# Patient Record
Sex: Female | Born: 1955
Health system: Southern US, Community
[De-identification: ages and names within clinical notes are randomized; demographics above are authoritative.]

## PROBLEM LIST (undated history)

## (undated) ENCOUNTER — Emergency Department (HOSPITAL_COMMUNITY): Discharge status: Against Medical Advice | Payer: 59

## (undated) DIAGNOSIS — F988 Other specified behavioral and emotional disorders with onset usually occurring in childhood and adolescence: Secondary | ICD-10-CM

## (undated) HISTORY — PX: TUBAL LIGATION: SHX77

---

## 1999-08-05 ENCOUNTER — Other Ambulatory Visit: Admission: RE | Admit: 1999-08-05 | Discharge: 1999-08-05 | Payer: Self-pay | Admitting: Family Medicine

## 2000-08-22 ENCOUNTER — Other Ambulatory Visit: Admission: RE | Admit: 2000-08-22 | Discharge: 2000-08-22 | Payer: Self-pay | Admitting: Family Medicine

## 2000-11-16 ENCOUNTER — Encounter: Payer: Self-pay | Admitting: Family Medicine

## 2000-11-16 ENCOUNTER — Ambulatory Visit (HOSPITAL_COMMUNITY): Admission: RE | Admit: 2000-11-16 | Discharge: 2000-11-16 | Payer: Self-pay | Admitting: *Deleted

## 2002-05-12 ENCOUNTER — Ambulatory Visit (HOSPITAL_COMMUNITY): Admission: RE | Admit: 2002-05-12 | Discharge: 2002-05-12 | Payer: Self-pay | Admitting: Family Medicine

## 2002-05-12 ENCOUNTER — Encounter: Payer: Self-pay | Admitting: Family Medicine

## 2006-05-03 ENCOUNTER — Other Ambulatory Visit: Admission: RE | Admit: 2006-05-03 | Discharge: 2006-05-03 | Payer: Self-pay | Admitting: *Deleted

## 2007-03-11 ENCOUNTER — Emergency Department (HOSPITAL_COMMUNITY): Admission: EM | Admit: 2007-03-11 | Discharge: 2007-03-12 | Payer: Self-pay | Admitting: Emergency Medicine

## 2013-11-25 ENCOUNTER — Observation Stay (HOSPITAL_COMMUNITY): Payer: 59

## 2013-11-25 ENCOUNTER — Emergency Department (HOSPITAL_COMMUNITY): Payer: 59

## 2013-11-25 ENCOUNTER — Observation Stay (HOSPITAL_COMMUNITY): Payer: 59 | Admitting: Anesthesiology

## 2013-11-25 ENCOUNTER — Observation Stay (HOSPITAL_COMMUNITY)
Admission: EM | Admit: 2013-11-25 | Discharge: 2013-11-26 | Disposition: A | Discharge status: Home / Self Care | Payer: 59 | Attending: Surgery | Admitting: Surgery

## 2013-11-25 ENCOUNTER — Encounter (HOSPITAL_COMMUNITY): Payer: 59 | Admitting: Anesthesiology

## 2013-11-25 ENCOUNTER — Encounter (HOSPITAL_COMMUNITY)
Admission: EM | Disposition: A | Discharge status: Home / Self Care | Payer: Self-pay | Source: Home / Self Care | Attending: Emergency Medicine

## 2013-11-25 ENCOUNTER — Encounter (HOSPITAL_COMMUNITY): Payer: Self-pay | Admitting: Emergency Medicine

## 2013-11-25 DIAGNOSIS — K8020 Calculus of gallbladder without cholecystitis without obstruction: Secondary | ICD-10-CM

## 2013-11-25 DIAGNOSIS — K811 Chronic cholecystitis: Secondary | ICD-10-CM

## 2013-11-25 DIAGNOSIS — D259 Leiomyoma of uterus, unspecified: Secondary | ICD-10-CM | POA: Insufficient documentation

## 2013-11-25 DIAGNOSIS — F988 Other specified behavioral and emotional disorders with onset usually occurring in childhood and adolescence: Secondary | ICD-10-CM | POA: Insufficient documentation

## 2013-11-25 DIAGNOSIS — K810 Acute cholecystitis: Principal | ICD-10-CM | POA: Insufficient documentation

## 2013-11-25 DIAGNOSIS — F17200 Nicotine dependence, unspecified, uncomplicated: Secondary | ICD-10-CM | POA: Insufficient documentation

## 2013-11-25 HISTORY — PX: CHOLECYSTECTOMY: SHX55

## 2013-11-25 HISTORY — DX: Other specified behavioral and emotional disorders with onset usually occurring in childhood and adolescence: F98.8

## 2013-11-25 LAB — COMPREHENSIVE METABOLIC PANEL
ALT: 19 U/L (ref 0–35)
AST: 22 U/L (ref 0–37)
Albumin: 3.9 g/dL (ref 3.5–5.2)
Alkaline Phosphatase: 75 U/L (ref 39–117)
BUN: 12 mg/dL (ref 6–23)
CO2: 25 mEq/L (ref 19–32)
Calcium: 9.5 mg/dL (ref 8.4–10.5)
Chloride: 102 mEq/L (ref 96–112)
Creatinine, Ser: 0.81 mg/dL (ref 0.50–1.10)
GFR calc Af Amer: >90 mL/min (ref >90)
GFR calc non Af Amer: 80 mL/min — ABNORMAL LOW (ref >90)
Glucose, Bld: 92 mg/dL (ref 70–99)
Potassium: 4.7 mEq/L (ref 3.7–5.3)
Sodium: 139 mEq/L (ref 137–147)
Total Bilirubin: 0.3 mg/dL (ref 0.3–1.2)
Total Protein: 7.5 g/dL (ref 6.0–8.3)

## 2013-11-25 LAB — CBC WITH DIFFERENTIAL/PLATELET
Basophils Absolute: 0 10*3/uL (ref 0.0–0.1)
Basophils Relative: 1 % (ref 0–1)
Eosinophils Absolute: 0.2 10*3/uL (ref 0.0–0.7)
Eosinophils Relative: 4 % (ref 0–5)
HCT: 41.3 % (ref 36.0–46.0)
Hemoglobin: 14.2 g/dL (ref 12.0–15.0)
Lymphocytes Relative: 34 % (ref 12–46)
Lymphs Abs: 1.7 10*3/uL (ref 0.7–4.0)
MCH: 32.5 pg (ref 26.0–34.0)
MCHC: 34.4 g/dL (ref 30.0–36.0)
MCV: 94.5 fL (ref 78.0–100.0)
Monocytes Absolute: 0.5 10*3/uL (ref 0.1–1.0)
Monocytes Relative: 10 % (ref 3–12)
Neutro Abs: 2.7 10*3/uL (ref 1.7–7.7)
Neutrophils Relative %: 52 % (ref 43–77)
Platelets: 299 10*3/uL (ref 150–400)
RBC: 4.37 MIL/uL (ref 3.87–5.11)
RDW: 13.6 % (ref 11.5–15.5)
WBC: 5.2 10*3/uL (ref 4.0–10.5)

## 2013-11-25 LAB — POCT I-STAT TROPONIN I: TROPONIN I, POC: 0 ng/mL (ref 0.00–0.08)

## 2013-11-25 LAB — LIPASE, BLOOD: Lipase: 52 U/L (ref 11–59)

## 2013-11-25 SURGERY — LAPAROSCOPIC CHOLECYSTECTOMY WITH INTRAOPERATIVE CHOLANGIOGRAM
Anesthesia: General | Site: Abdomen

## 2013-11-25 MED ORDER — PROPOFOL 10 MG/ML IV BOLUS
INTRAVENOUS | Status: AC
  Filled 2013-11-25: qty 20

## 2013-11-25 MED ORDER — 0.9 % SODIUM CHLORIDE (POUR BTL) OPTIME
TOPICAL | Status: DC | PRN
  Administered 2013-11-25: 1000 mL

## 2013-11-25 MED ORDER — LACTATED RINGERS IV SOLN
INTRAVENOUS | Status: DC
  Administered 2013-11-25: 1000 mL via INTRAVENOUS

## 2013-11-25 MED ORDER — GLUCAGON HCL (RDNA) 1 MG IJ SOLR
INTRAMUSCULAR | Status: AC
  Filled 2013-11-25: qty 1

## 2013-11-25 MED ORDER — PROMETHAZINE HCL 25 MG/ML IJ SOLN: 6.2500 mg | INTRAMUSCULAR | Status: DC | PRN

## 2013-11-25 MED ORDER — ONDANSETRON HCL 4 MG/2ML IJ SOLN
INTRAMUSCULAR | Status: AC
  Filled 2013-11-25: qty 2

## 2013-11-25 MED ORDER — LACTATED RINGERS IV SOLN
INTRAVENOUS | Status: DC | PRN
  Administered 2013-11-25 (×3): via INTRAVENOUS

## 2013-11-25 MED ORDER — HYDROMORPHONE HCL PF 1 MG/ML IJ SOLN
1.0000 mg | Freq: Once | INTRAMUSCULAR | Status: AC
  Administered 2013-11-25: 1 mg via INTRAVENOUS
  Filled 2013-11-25: qty 1

## 2013-11-25 MED ORDER — ONDANSETRON HCL 4 MG/2ML IJ SOLN
INTRAMUSCULAR | Status: DC | PRN
  Administered 2013-11-25: 4 mg via INTRAVENOUS

## 2013-11-25 MED ORDER — EPHEDRINE SULFATE 50 MG/ML IJ SOLN
INTRAMUSCULAR | Status: DC | PRN
  Administered 2013-11-25: 10 mg via INTRAVENOUS

## 2013-11-25 MED ORDER — ONDANSETRON HCL 4 MG/2ML IJ SOLN: 4.0000 mg | Freq: Four times a day (QID) | INTRAMUSCULAR | Status: DC | PRN

## 2013-11-25 MED ORDER — NEOSTIGMINE METHYLSULFATE 1 MG/ML IJ SOLN
INTRAMUSCULAR | Status: DC | PRN
  Administered 2013-11-25: 3 mg via INTRAVENOUS

## 2013-11-25 MED ORDER — LACTATED RINGERS IR SOLN
Status: DC | PRN
  Administered 2013-11-25: 1000 mL

## 2013-11-25 MED ORDER — POTASSIUM CHLORIDE IN NACL 20-0.9 MEQ/L-% IV SOLN
INTRAVENOUS | Status: DC
  Administered 2013-11-25: 10:00:00 via INTRAVENOUS
  Filled 2013-11-25 (×4): qty 1000

## 2013-11-25 MED ORDER — BUPIVACAINE HCL (PF) 0.5 % IJ SOLN
INTRAMUSCULAR | Status: AC
  Filled 2013-11-25: qty 30

## 2013-11-25 MED ORDER — PIPERACILLIN-TAZOBACTAM 3.375 G IVPB
3.3750 g | Freq: Three times a day (TID) | INTRAVENOUS | Status: DC
  Administered 2013-11-25: 3.375 g via INTRAVENOUS
  Filled 2013-11-25 (×2): qty 50

## 2013-11-25 MED ORDER — ROCURONIUM BROMIDE 100 MG/10ML IV SOLN
INTRAVENOUS | Status: AC
  Filled 2013-11-25: qty 1

## 2013-11-25 MED ORDER — LACTATED RINGERS IV SOLN: INTRAVENOUS | Status: DC

## 2013-11-25 MED ORDER — PHENYLEPHRINE HCL 10 MG/ML IJ SOLN
INTRAMUSCULAR | Status: DC | PRN
  Administered 2013-11-25: 80 ug via INTRAVENOUS

## 2013-11-25 MED ORDER — LIDOCAINE HCL (CARDIAC) 20 MG/ML IV SOLN
INTRAVENOUS | Status: DC | PRN
  Administered 2013-11-25: 50 mg via INTRAVENOUS

## 2013-11-25 MED ORDER — HYDROMORPHONE HCL PF 1 MG/ML IJ SOLN
0.2500 mg | INTRAMUSCULAR | Status: DC | PRN
  Administered 2013-11-25 (×2): 0.5 mg via INTRAVENOUS

## 2013-11-25 MED ORDER — BUPIVACAINE HCL 0.5 % IJ SOLN
INTRAMUSCULAR | Status: DC | PRN
  Administered 2013-11-25: 15 mL

## 2013-11-25 MED ORDER — PHENYLEPHRINE 40 MCG/ML (10ML) SYRINGE FOR IV PUSH (FOR BLOOD PRESSURE SUPPORT)
PREFILLED_SYRINGE | INTRAVENOUS | Status: AC
  Filled 2013-11-25: qty 10

## 2013-11-25 MED ORDER — IOHEXOL 300 MG/ML  SOLN
INTRAMUSCULAR | Status: DC | PRN
  Administered 2013-11-25: 5 mL

## 2013-11-25 MED ORDER — ROCURONIUM BROMIDE 100 MG/10ML IV SOLN
INTRAVENOUS | Status: DC | PRN
  Administered 2013-11-25: 50 mg via INTRAVENOUS

## 2013-11-25 MED ORDER — MORPHINE SULFATE 2 MG/ML IJ SOLN
2.0000 mg | INTRAMUSCULAR | Status: DC | PRN
  Administered 2013-11-25 (×2): 2 mg via INTRAVENOUS
  Administered 2013-11-25: 1 mg via INTRAVENOUS
  Filled 2013-11-25 (×3): qty 1

## 2013-11-25 MED ORDER — LIDOCAINE HCL (CARDIAC) 20 MG/ML IV SOLN
INTRAVENOUS | Status: AC
  Filled 2013-11-25: qty 5

## 2013-11-25 MED ORDER — HYDROMORPHONE HCL PF 1 MG/ML IJ SOLN: 1.0000 mg | INTRAMUSCULAR | Status: DC | PRN

## 2013-11-25 MED ORDER — HYDROMORPHONE HCL PF 1 MG/ML IJ SOLN
INTRAMUSCULAR | Status: AC
  Filled 2013-11-25: qty 1

## 2013-11-25 MED ORDER — FENTANYL CITRATE 0.05 MG/ML IJ SOLN
INTRAMUSCULAR | Status: DC | PRN
  Administered 2013-11-25 (×2): 50 ug via INTRAVENOUS

## 2013-11-25 MED ORDER — PROPOFOL 10 MG/ML IV BOLUS
INTRAVENOUS | Status: DC | PRN
  Administered 2013-11-25: 150 mg via INTRAVENOUS

## 2013-11-25 MED ORDER — FENTANYL CITRATE 0.05 MG/ML IJ SOLN
INTRAMUSCULAR | Status: AC
  Filled 2013-11-25: qty 5

## 2013-11-25 MED ORDER — GLYCOPYRROLATE 0.2 MG/ML IJ SOLN
INTRAMUSCULAR | Status: DC | PRN
  Administered 2013-11-25: 0.4 mg via INTRAVENOUS

## 2013-11-25 MED ORDER — AMPHETAMINE-DEXTROAMPHETAMINE 10 MG PO TABS: 20.0000 mg | ORAL_TABLET | Freq: Every day | ORAL | Status: DC

## 2013-11-25 MED ORDER — GLYCOPYRROLATE 0.2 MG/ML IJ SOLN
INTRAMUSCULAR | Status: AC
  Filled 2013-11-25: qty 2

## 2013-11-25 MED ORDER — OXYCODONE HCL 5 MG PO TABS
5.0000 mg | ORAL_TABLET | ORAL | Status: DC | PRN
  Administered 2013-11-25 – 2013-11-26 (×5): 10 mg via ORAL
  Filled 2013-11-25 (×5): qty 2

## 2013-11-25 MED ORDER — MIDAZOLAM HCL 2 MG/2ML IJ SOLN
INTRAMUSCULAR | Status: AC
  Filled 2013-11-25: qty 2

## 2013-11-25 MED ORDER — MIDAZOLAM HCL 5 MG/5ML IJ SOLN
INTRAMUSCULAR | Status: DC | PRN
  Administered 2013-11-25 (×2): 1 mg via INTRAVENOUS

## 2013-11-25 SURGICAL SUPPLY — 48 items
APL SKNCLS STERI-STRIP NONHPOA (GAUZE/BANDAGES/DRESSINGS) ×1
APPLIER CLIP 5 13 M/L LIGAMAX5 (MISCELLANEOUS) ×2
APR CLP MED LRG 5 ANG JAW (MISCELLANEOUS) ×1
BAG SPEC RTRVL LRG 6X4 10 (ENDOMECHANICALS) ×1
BENZOIN TINCTURE PRP APPL 2/3 (GAUZE/BANDAGES/DRESSINGS) ×2 IMPLANT
CHLORAPREP W/TINT 26ML (MISCELLANEOUS) ×2 IMPLANT
CLIP APPLIE 5 13 M/L LIGAMAX5 (MISCELLANEOUS) ×1 IMPLANT
COVER MAYO STAND STRL (DRAPES) ×2 IMPLANT
DECANTER SPIKE VIAL GLASS SM (MISCELLANEOUS) ×1 IMPLANT
DRAPE C-ARM 42X120 X-RAY (DRAPES) ×2 IMPLANT
DRAPE LAPAROSCOPIC ABDOMINAL (DRAPES) ×2 IMPLANT
DRAPE UTILITY XL STRL (DRAPES) ×2 IMPLANT
DRSG TEGADERM 2-3/8X2-3/4 SM (GAUZE/BANDAGES/DRESSINGS) ×6 IMPLANT
ELECT REM PT RETURN 9FT ADLT (ELECTROSURGICAL) ×2
ELECTRODE REM PT RTRN 9FT ADLT (ELECTROSURGICAL) ×1 IMPLANT
ENDOLOOP SUT PDS II  0 18 (SUTURE)
ENDOLOOP SUT PDS II 0 18 (SUTURE) IMPLANT
GAUZE SPONGE 2X2 8PLY STRL LF (GAUZE/BANDAGES/DRESSINGS) ×1 IMPLANT
GLOVE BIOGEL PI IND STRL 7.0 (GLOVE) IMPLANT
GLOVE BIOGEL PI IND STRL 7.5 (GLOVE) IMPLANT
GLOVE BIOGEL PI IND STRL 8 (GLOVE) IMPLANT
GLOVE BIOGEL PI INDICATOR 7.0 (GLOVE) ×2
GLOVE BIOGEL PI INDICATOR 7.5 (GLOVE) ×1
GLOVE BIOGEL PI INDICATOR 8 (GLOVE) ×1
GLOVE ECLIPSE 8.0 STRL XLNG CF (GLOVE) ×2 IMPLANT
GLOVE INDICATOR 8.0 STRL GRN (GLOVE) ×2 IMPLANT
GLOVE SURG SS PI 6.5 STRL IVOR (GLOVE) ×1 IMPLANT
GLOVE SURG SS PI 7.5 STRL IVOR (GLOVE) ×2 IMPLANT
GOWN STRL REIN 3XL LVL4 (GOWN DISPOSABLE) ×1 IMPLANT
GOWN STRL REUS W/TWL XL LVL3 (GOWN DISPOSABLE) ×8 IMPLANT
HEMOSTAT SNOW SURGICEL 2X4 (HEMOSTASIS) IMPLANT
KIT BASIN OR (CUSTOM PROCEDURE TRAY) ×2 IMPLANT
POUCH SPECIMEN RETRIEVAL 10MM (ENDOMECHANICALS) ×2 IMPLANT
SCISSORS LAP 5X35 DISP (ENDOMECHANICALS) ×2 IMPLANT
SET CHOLANGIOGRAPH MIX (MISCELLANEOUS) ×2 IMPLANT
SET IRRIG TUBING LAPAROSCOPIC (IRRIGATION / IRRIGATOR) ×2 IMPLANT
SLEEVE XCEL OPT CAN 5 100 (ENDOMECHANICALS) ×4 IMPLANT
SOLUTION ANTI FOG 6CC (MISCELLANEOUS) ×2 IMPLANT
SPONGE GAUZE 2X2 STER 10/PKG (GAUZE/BANDAGES/DRESSINGS) ×1
STRIP CLOSURE SKIN 1/2X4 (GAUZE/BANDAGES/DRESSINGS) ×2 IMPLANT
SUT MNCRL AB 4-0 PS2 18 (SUTURE) ×3 IMPLANT
TOWEL OR 17X26 10 PK STRL BLUE (TOWEL DISPOSABLE) ×2 IMPLANT
TOWEL OR NON WOVEN STRL DISP B (DISPOSABLE) ×2 IMPLANT
TRAY LAP CHOLE (CUSTOM PROCEDURE TRAY) ×2 IMPLANT
TROCAR BLADELESS OPT 5 100 (ENDOMECHANICALS) ×2 IMPLANT
TROCAR XCEL BLUNT TIP 100MML (ENDOMECHANICALS) ×2 IMPLANT
TROCAR XCEL NON-BLD 11X100MML (ENDOMECHANICALS) IMPLANT
TUBING INSUFFLATION 10FT LAP (TUBING) ×2 IMPLANT

## 2013-11-25 NOTE — Anesthesia Postprocedure Evaluation (Signed)
Anesthesia Post Note  Patient: Natalie Lara  Procedure(s) Performed: Procedure(s) (LRB): LAPAROSCOPIC CHOLECYSTECTOMY WITH INTRAOPERATIVE CHOLANGIOGRAM (N/A)  Anesthesia type: General  Patient location: PACU  Post pain: Pain level controlled  Post assessment: Post-op Vital signs reviewed  Last Vitals:  Filed Vitals:   11/25/13 1742  BP: 114/64  Pulse: 49  Temp: 36.4 C  Resp: 14    Post vital signs: Reviewed  Level of consciousness: sedated  Complications: No apparent anesthesia complications

## 2013-11-25 NOTE — ED Notes (Signed)
Pt c/o severe epigastric pain; woke her up at 3am but wasn't that bad; continued to get worse; same pain on Saturday night; no other symptoms

## 2013-11-25 NOTE — Transfer of Care (Signed)
Immediate Anesthesia Transfer of Care Note  Patient: Natalie Lara  Procedure(s) Performed: Procedure(s): LAPAROSCOPIC CHOLECYSTECTOMY WITH INTRAOPERATIVE CHOLANGIOGRAM (N/A)  Patient Location: PACU  Anesthesia Type:General  Level of Consciousness: awake, alert  and oriented  Airway & Oxygen Therapy: Patient Spontanous Breathing and Patient connected to face mask oxygen  Post-op Assessment: Report given to PACU RN and Post -op Vital signs reviewed and stable  Post vital signs: Reviewed and stable  Complications: No apparent anesthesia complications

## 2013-11-25 NOTE — ED Provider Notes (Signed)
Medical screening examination/treatment/procedure(s) were performed by non-physician practitioner and as supervising physician I was immediately available for consultation/collaboration.  EKG Interpretation    Date/Time:  Tuesday November 25 2013 06:09:27 EST Ventricular Rate:  86 PR Interval:  144 QRS Duration: 88 QT Interval:  358 QTC Calculation: 428 R Axis:   68 Text Interpretation:  Sinus rhythm Probable left atrial enlargement RSR' in V1 or V2, right VCD or RVH No previous ECGs available Confirmed by YAO  MD, DAVID [phone]) on 11/25/2013 7:58:09 AM              Alfonzo Feller, DO 11/25/13 1612

## 2013-11-25 NOTE — Progress Notes (Signed)
[facility] provided pt with a list of primary care resources, [facility] information, and a [facility] Orange Card application.  °

## 2013-11-25 NOTE — Preoperative (Signed)
Beta Blockers   Reason not to administer Beta Blockers:Not Applicable 

## 2013-11-25 NOTE — Progress Notes (Signed)
Dr. Winfred Leeds made aware of patient's heart rates being in the mid 19s- O.K. To go to floor

## 2013-11-25 NOTE — Op Note (Signed)
Preoperative diagnosis:  Symptomatic cholelithiasis  Postoperative diagnosis:  Same with retro gastric mass  Procedure: Laparoscopic cholecystectomy with cholangiogram.  Surgeon: Jackolyn Confer, M.D.  Asst.:  Will Creig Hines, P.A.  Anesthesia: General  Indication:   This is a 58 year old female with persistent epigastric and right upper quadrant pain. She had an episode Sunday which was short lived. She had another episode today that did not resolve. She presented to the emergency department. Her liver function tests and CBC were normal. Ultrasound demonstrated what appeared to be small gallstones and common bile duct diameter was normal. She was given pain medication but the pain continued to come back. She is now brought to the operating room for cholecystectomy.  Technique: She was brought to the operating room, placed supine on the operating table, and a general anesthetic was administered.  The abdominal wall was then sterilely prepped and draped. Local anesthetic (Marcaine) was infiltrated in the subumbilical region. A small subumbilical incision was made through the skin, subcutaneous tissue, fascia, and peritoneum entering the peritoneal cavity under direct vision. A pursestring suture of 0 Vicryl was placed around the edges of the fascia. A Hassan trocar was introduced into the peritoneal cavity and a pneumoperitoneum was created by insufflation of carbon dioxide gas. The laparoscope was introduced into the trocar and no underlying bleeding or organ injury was noted. She was then placed in the reverse Trendelenburg position with the right side tilted slightly up.  Three 5 mm trocars were then placed into the abdominal cavity under laparoscopic vision. One in the epigastric area, and 2 in the right upper quadrant area. The gallbladder was visualized and the fundus was grasped and retracted toward the right shoulder.  There were were no acute inflammatory changes of the gallbladder noted. There  was some edema noted. There also appeared to be a firm retrogastric mass in the area of the antrum and pylorus.  The infundibulum was mobilized with dissection close to the gallbladder and retracted laterally. The cystic duct was identified and a window was created around it. The cystic artery was also identified and a window was created around it. The critical view was achieved. A clip was placed at the neck of the gallbladder. A small incision was made in the cystic duct. A cholangiocatheter was introduced through the anterior abdominal wall and placed in the cystic duct. A intraoperative cholangiogram was then performed.  Under real-time fluoroscopy, dilute contrast was injected into the cystic duct.  The common hepatic duct, the right and left hepatic ducts, and the common duct were all visualized. Contrast drained into the duodenum without obvious evidence of any obstructing ductal lesion. The final report is pending the Radiologist's interpretation.  The cholangiocatheter was removed, the cystic duct was clipped 3 times on the biliary side, and then the cystic duct was divided sharply. No bile leak was noted from the cystic duct stump.  The cystic artery was then clipped and divided. Following this the gallbladder was dissected free from the liver using electrocautery. The gallbladder was then placed in a retrieval bag and removed from the abdominal cavity through the subumbilical incision.  The gallbladder fossa was inspected, irrigated, and bleeding was controlled with electrocautery. Inspection showed that hemostasis was adequate and there was no evidence of bile leak.  The irrigation fluid was evacuated as much as possible.  The subumbilical trocar was removed and the fascial defect was closed by tightening and tying down the pursestring suture under laparoscopic vision.  The remaining trocars were removed  and the pneumoperitoneum was released. The skin incisions were closed with 4-0 Monocryl  subcuticular stitches. Steri-Strips and sterile dressings were applied.  The procedure was well-tolerated without any apparent complications. She was taken to the recovery room in satisfactory condition.  Will order a CT scan for tomorrow to evaluate what appeared to be a retrogastric mass.

## 2013-11-25 NOTE — ED Provider Notes (Signed)
CSN: [id number]     Arrival date & time 11/25/13  0557 History   First MD Initiated Contact with Patient 11/25/13 [phone]     Chief Complaint  Patient presents with  . Abdominal Pain   (Consider location/radiation/quality/duration/timing/severity/associated sxs/prior Treatment) HPI Comments: Pt is a 58 y/o female who presents to the ED complaining of sudden onset mid-epigastric pain that woke her up from sleep around 3:00 am, worsened over the next few hours and became severe. Since arriving at the ED, pain has been intermittent, worse with laughing and laying flat, alleviated by standing. Pain described as sharp and cramping, radiating to both sides of her upper abdomen.  Denies chest pain, fever, sob. Last night for dinner ate a salad and had a brownie and decaf coffee. Denies nausea, vomiting, diarrhea, changes in urine output. Denies ever having pain like this in the past. No hx of heartburn. No hx of abdominal surgeries.   Patient is a 58 y.o. female presenting with abdominal pain. The history is provided by the patient.  Abdominal Pain   Past Medical History  Diagnosis Date  . ADD (attention deficit disorder)    History reviewed. No pertinent past surgical history. No family history on file. History  Substance Use Topics  . Smoking status: Current Every Day Smoker -- 1.00 packs/day    Types: Cigarettes  . Smokeless tobacco: Not on file  . Alcohol Use: Not on file   OB History   Grav Para Term Preterm Abortions TAB SAB Ect Mult Living                 Review of Systems  Gastrointestinal: Positive for abdominal pain.  All other systems reviewed and are negative.    Allergies  Other  Home Medications   Current Outpatient Rx  Name  Route  Sig  Dispense  Refill  . acyclovir (ZOVIRAX) 400 MG tablet   Oral   Take 400 mg by mouth 2 (two) times daily.         Marland Kitchen amphetamine-dextroamphetamine (ADDERALL) 20 MG tablet   Oral   Take 20 mg by mouth daily.         . calcium  citrate-vitamin D (CITRACAL+D) 315-200 MG-UNIT per tablet   Oral   Take 1 tablet by mouth daily.         Marland Kitchen ibuprofen (ADVIL,MOTRIN) 200 MG tablet   Oral   Take 400 mg by mouth every 6 (six) hours as needed for headache.         Marland Kitchen LYSINE PO   Oral   Take 1 capsule by mouth daily.          BP 112/69  Pulse 97  Temp(Src) 97.7 F (36.5 C) (Oral)  Resp 18  SpO2 100%  LMP 09/25/2013 Physical Exam  Nursing note and vitals reviewed. Constitutional: She is oriented to person, place, and time. She appears well-developed and well-nourished. No distress.  HENT:  Head: Normocephalic and atraumatic.  Mouth/Throat: Oropharynx is clear and moist.  Eyes: Conjunctivae are normal.  Neck: Normal range of motion. Neck supple.  Cardiovascular: Normal rate, regular rhythm and normal heart sounds.   Pulmonary/Chest: Effort normal and breath sounds normal.  Abdominal: Soft. Bowel sounds are normal. There is tenderness in the right upper quadrant, epigastric area and left upper quadrant. There is guarding. There is no rigidity and no rebound.  No peritoneal signs. TTP RUQ > LUQ. Unable to assess Murphy's sign due to pain.  Musculoskeletal: Normal range of motion.  She exhibits no edema.  Neurological: She is alert and oriented to person, place, and time.  Skin: Skin is warm and dry. She is not diaphoretic.  Psychiatric: She has a normal mood and affect. Her behavior is normal.    ED Course  Procedures (including critical care time) Labs Review Labs Reviewed  COMPREHENSIVE METABOLIC PANEL - Abnormal; Notable for the following:    GFR calc non Af Amer 80 (*)    All other components within normal limits  CBC WITH DIFFERENTIAL  LIPASE, BLOOD  POCT I-STAT TROPONIN I   Imaging Review US Abdomen Complete  11/25/2013   CLINICAL DATA:  Abdominal pain  EXAM: ULTRASOUND ABDOMEN COMPLETE  COMPARISON:  None.  FINDINGS: Gallbladder:  Within the gallbladder, there is sludge with tiny interspersed  echogenic foci which move and shadow consistent with gallstones. There is no gallbladder wall thickening or pericholecystic fluid. No sonographic Murphy sign noted.  Common bile duct:  Diameter: 5 mm. There is no intrahepatic, common hepatic, or common bile duct dilatation.  Liver:  No focal lesion identified. Within normal limits in parenchymal echogenicity.  IVC:  No abnormality visualized.  Pancreas:  No mass or inflammatory focus.  Spleen:  Size and appearance within normal limits.  Right Kidney:  Length: 10.5 cm. Echogenicity within normal limits. No mass or hydronephrosis visualized.  Left Kidney:  Length: 11.3 cm. Echogenicity within normal limits. No mass or hydronephrosis visualized.  Abdominal aorta:  No aneurysm visualized.  Other findings:  No appreciable ascites.  IMPRESSION: There is mild sludge with tiny interspersed gallstones in the gallbladder. Study otherwise unremarkable.   Electronically Signed   By: Lowella Grip M.D.   On: 11/25/2013 08:56    EKG Interpretation    Date/Time:  Tuesday November 25 2013 06:09:27 EST Ventricular Rate:  86 PR Interval:  144 QRS Duration: 88 QT Interval:  358 QTC Calculation: 428 R Axis:   68 Text Interpretation:  Sinus rhythm Probable left atrial enlargement RSR' in V1 or V2, right VCD or RVH No previous ECGs available Confirmed by YAO  MD, DAVID [id number]) on 11/25/2013 7:58:09 AM           Date: 11/25/2013  Rate: 86  Rhythm: normal sinus rhythm  QRS Axis: normal  Intervals: normal  ST/T Wave abnormalities: normal  Conduction Disutrbances:none  Narrative Interpretation: NSR, probable LAE, RSR' in V1 or V2, right VCD or RVH  Old EKG Reviewed: none available    MDM   1. Cholelithiasis     Pt presenting with mid-epigastric, RUQ abdominal pain, no associated symptoms. She is well appearing and in NAD, standing in room for comfort. Afebrile, normal VS. Possible gallbladder pathology. Labs and abdominal US pending. NPO, pain  control. 9:13 AM Abdominal US showing mild gallbladder sludge with tiny interspaced gallstones. Pt resting comfortably after receiving dilaudid, however on re-examination, she is extremely tender to RUQ and mid-epigastric area. I spoke with Modena Jansky, general surgery PA with [facility] who will evaluate patient for admission.  Illene Labrador, PA-C 11/25/13 1558

## 2013-11-25 NOTE — Anesthesia Preprocedure Evaluation (Addendum)
Anesthesia Evaluation  Patient identified by MRN, date of birth, ID band Patient awake    Reviewed: Allergy & Precautions, H&P , NPO status , Patient's Chart, lab work & pertinent test results  Airway Mallampati: II TM Distance: >3 FB Neck ROM: Full    Dental  (+) Teeth Intact and Dental Advisory Given   Pulmonary neg pulmonary ROS, Current Smoker,    Pulmonary exam normal       Cardiovascular negative cardio ROS  Rhythm:Regular Rate:Normal     Neuro/Psych negative neurological ROS  negative psych ROS   GI/Hepatic negative GI ROS, Neg liver ROS,   Endo/Other  negative endocrine ROS  Renal/GU negative Renal ROS  negative genitourinary   Musculoskeletal negative musculoskeletal ROS (+)   Abdominal   Peds  Hematology negative hematology ROS (+)   Anesthesia Other Findings   Reproductive/Obstetrics                         Anesthesia Physical Anesthesia Plan  ASA: II  Anesthesia Plan: General   Post-op Pain Management:    Induction: Intravenous  Airway Management Planned: Oral ETT  Additional Equipment:   Intra-op Plan:   Post-operative Plan: Extubation in OR  Informed Consent: I have reviewed the patients History and Physical, chart, labs and discussed the procedure including the risks, benefits and alternatives for the proposed anesthesia with the patient or authorized representative who has indicated his/her understanding and acceptance.   Dental advisory given  Plan Discussed with: CRNA  Anesthesia Plan Comments: (No eye ointment applied intraop.; prior history of herpetic infection of R eye in past.)       Anesthesia Quick Evaluation

## 2013-11-25 NOTE — H&P (Signed)
Natalie Lara is an 58 y.o. female.     Chief Complaint: acute on set of Abdominal pain this AM HPI: 58 y/o in good health, had an episode of acute pain early AM last Sunday.  Pain was acute, mid epigastric and lasted a couple hours.  She said she had to rest all day Sunday because it took so much out of her.  She was OK yesterday and had a salad and Brownie for supper.  She awoke early this AM about 3 with pain much worse than her episode on Sunday AM.  She could not get comfortable so she came to ER here at [facility].  Again pain is mid epigastric and goes to the side left and right.  She is extremely tender/painful with palpation of the RUQ. No nausea or vomiting with either episode.  She has never had an issue prior to Sunday AM.   Work up in the ER shows:  There is mild sludge with tiny interspersed gallstones in the gallbladder. Study otherwise unremarkable. Her labs show normal CBC, and normal CMP.  Troponin is normal, lipase is normal.  We are ask to see.    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 Father:  Deceased with trauma 1 son with ADD([employer] 7 tours)   Social History:  Tobacco:  1PPD for about 29 years Drugs:  None Alcohol:  Social   Allergies:  No allergies, but her Eye doctor has told her to avoid steroids because of her eye problems.   Allergies  Allergen Reactions  . Other Other (See Comments)    Patient stated that she can not take steroids    Prior to Admission medications   Medication Sig Start Date End Date Taking? Authorizing Provider  acyclovir (ZOVIRAX) 400 MG tablet Take 400 mg by mouth 2 (two) times daily.   Yes Historical Provider, MD  amphetamine-dextroamphetamine (ADDERALL) 20 MG tablet Take 20 mg by mouth  daily.   Yes Historical Provider, MD  calcium citrate-vitamin D (CITRACAL+D) 315-200 MG-UNIT per tablet Take 1 tablet by mouth daily.   Yes Historical Provider, MD  ibuprofen (ADVIL,MOTRIN) 200 MG tablet Take 400 mg by mouth every 6 (six) hours as needed for headache.   Yes Historical Provider, MD  LYSINE PO Take 1 capsule by mouth daily.   Yes Historical Provider, MD     Results for orders placed during the hospital encounter of 11/25/13 (from the past 48 hour(s))  CBC WITH DIFFERENTIAL     Status: None   Collection Time    11/25/13  6:03 AM      Result Value Range   WBC 5.2  4.0 - 10.5 K/uL   RBC 4.37  3.87 - 5.11 MIL/uL   Hemoglobin 14.2  12.0 - 15.0 g/dL   HCT 41.3  36.0 - 46.0 %   MCV 94.5  78.0 - 100.0 fL   MCH 32.5  26.0 - 34.0 pg   MCHC 34.4  30.0 - 36.0 g/dL   RDW 13.6  11.5 - 15.5 %   Platelets 299  150 - 400 K/uL   Neutrophils Relative % 52  43 - 77 %   Neutro Abs 2.7  1.7 - 7.7 K/uL   Lymphocytes Relative 34  12 - 46 %   Lymphs Abs 1.7  0.7 - 4.0 K/uL   Monocytes Relative 10  3 - 12 %   Monocytes Absolute 0.5  0.1 - 1.0 K/uL   Eosinophils Relative 4  0 - 5 %   Eosinophils Absolute 0.2  0.0 - 0.7 K/uL   Basophils Relative 1  0 - 1 %   Basophils Absolute 0.0  0.0 - 0.1 K/uL  COMPREHENSIVE METABOLIC PANEL     Status: Abnormal   Collection Time    11/25/13  6:03 AM      Result Value Range   Sodium 139  137 - 147 mEq/L   Potassium 4.7  3.7 - 5.3 mEq/L   Chloride 102  96 - 112 mEq/L   CO2 25  19 - 32 mEq/L   Glucose, Bld 92  70 - 99 mg/dL   BUN 12  6 - 23 mg/dL   Creatinine, Ser 0.81  0.50 - 1.10 mg/dL   Calcium 9.5  8.4 - 10.5 mg/dL   Total Protein 7.5  6.0 - 8.3 g/dL   Albumin 3.9  3.5 - 5.2 g/dL   AST 22  0 - 37 U/L   ALT 19  0 - 35 U/L   Alkaline Phosphatase 75  39 - 117 U/L   Total Bilirubin 0.3  0.3 - 1.2 mg/dL   GFR calc non Af Amer 80 (*) >90 mL/min   GFR calc Af Amer >90  >90 mL/min   Comment: (NOTE)     The eGFR has been calculated using the CKD EPI  equation.     This calculation has not been validated in all clinical situations.     eGFR's persistently <90 mL/min signify possible Chronic Kidney     Disease.  LIPASE, BLOOD     Status: None   Collection Time    11/25/13  6:03 AM      Result Value Range   Lipase 52  11 - 59 U/L  POCT I-STAT TROPONIN I     Status: None   Collection Time    11/25/13  6:20 AM      Result Value Range   Troponin i, poc 0.00  0.00 - 0.08 ng/mL   Comment 3            Comment: Due to the release kinetics of cTnI,     a negative result within the first hours     of the onset of symptoms does not rule out     myocardial infarction with certainty.     If myocardial infarction is still suspected,     repeat the test at appropriate intervals.   US Abdomen Complete  11/25/2013   CLINICAL DATA:  Abdominal pain  EXAM: ULTRASOUND ABDOMEN COMPLETE  COMPARISON:  None.  FINDINGS: Gallbladder:  Within the gallbladder, there is sludge with tiny interspersed echogenic foci which move and shadow consistent with gallstones. There is no gallbladder wall thickening or pericholecystic fluid. No sonographic Murphy sign noted.  Common bile duct:  Diameter: 5 mm. There is no intrahepatic, common hepatic, or common bile duct dilatation.  Liver:  No focal lesion identified. Within normal limits in parenchymal echogenicity.  IVC:  No abnormality visualized.  Pancreas:  No mass or inflammatory focus.  Spleen:  Size and appearance within normal limits.  Right Kidney:  Length: 10.5 cm. Echogenicity within normal limits. No mass or hydronephrosis visualized.  Left  Kidney:  Length: 11.3 cm. Echogenicity within normal limits. No mass or hydronephrosis visualized.  Abdominal aorta:  No aneurysm visualized.  Other findings:  No appreciable ascites.  IMPRESSION: There is mild sludge with tiny interspersed gallstones in the gallbladder. Study otherwise unremarkable.   Electronically Signed   By: Lowella Grip M.D.   On: 11/25/2013 08:56     Review of Systems  Constitutional: Negative.   HENT: Negative.   Eyes: Negative.        She has had some type of herpes that affected her cornea, this is better now.  Respiratory: Negative.   Cardiovascular: Negative.   Gastrointestinal: Positive for abdominal pain (midepigastric, thought it was really bad gas.  Now pain in RUQ). Negative for heartburn, nausea, vomiting, diarrhea, constipation, blood in stool and melena.  Genitourinary: Negative.   Musculoskeletal: Positive for joint pain (right knee with some arthritis).  Skin: Negative.   Neurological: Negative.   Endo/Heme/Allergies: Negative.   Psychiatric/Behavioral: Negative.     Blood pressure 112/69, pulse 97, temperature 97.7 F (36.5 C), temperature source Oral, resp. rate 18, last menstrual period 09/25/2013, SpO2 100.00%. Physical Exam  Constitutional: She is oriented to person, place, and time. She appears well-developed and well-nourished. No distress.  She is still having allot of pain RUQ   HENT:  Head: Normocephalic and atraumatic.  Nose: Nose normal.  Eyes: Conjunctivae and EOM are normal. Pupils are equal, round, and reactive to light. Right eye exhibits no discharge. Left eye exhibits no discharge. No scleral icterus.  Neck: Normal range of motion. Neck supple. No JVD present. No tracheal deviation present. No thyromegaly present.  Cardiovascular: Normal rate, regular rhythm, normal heart sounds and intact distal pulses.  Exam reveals no gallop.   No murmur heard. Respiratory: Effort normal and breath sounds normal. No respiratory distress. She has no wheezes. She has no rales. She exhibits no tenderness.  GI: Soft. Bowel sounds are normal. She exhibits no distension (Pain mid epigastric and she is very tender over the RUQ.  No peritonitis) and no mass. There is tenderness. There is no rebound and no guarding.  Musculoskeletal: Normal range of motion. She exhibits no edema.  Lymphadenopathy:    She has no  cervical adenopathy.  Neurological: She is alert and oriented to person, place, and time. No cranial nerve deficit.  Skin: Skin is warm and dry. No rash noted. She is not diaphoretic. No erythema. No pallor.  Psychiatric: She has a normal mood and affect. Her behavior is normal. Judgment and thought content normal.     Assessment/Plan 1.  Symptomatic Gallstones 2.  ADD 3.  Tobacco use 4.  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  Remote hx of Migraines  Plan:  Hydrate, discuss cholecystectomy with Dr. Zella Richer for later today.  Start IV antibiotic now.  Natalie Lara 11/25/2013, 9:43 AM

## 2013-11-25 NOTE — H&P (Signed)
Patient seen and examined. Plan laparoscopic cholecystectomy for acute evolving acute cholecystitis.  I have explained the procedure, risks, and aftercare of cholecystectomy.  Risks include but are not limited to bleeding, infection, wound problems, anesthesia, diarrhea, bile leak, injury to common bile duct/liver/intestine.  She seems to understand and agrees to proceed.

## 2013-11-26 ENCOUNTER — Observation Stay (HOSPITAL_COMMUNITY): Payer: 59

## 2013-11-26 ENCOUNTER — Encounter: Payer: Self-pay | Admitting: General Surgery

## 2013-11-26 ENCOUNTER — Encounter (HOSPITAL_COMMUNITY): Payer: Self-pay | Admitting: Radiology

## 2013-11-26 LAB — COMPREHENSIVE METABOLIC PANEL
ALBUMIN: 3.7 g/dL (ref 3.5–5.2)
ALK PHOS: 57 U/L (ref 39–117)
ALT: 37 U/L — AB (ref 0–35)
AST: 37 U/L (ref 0–37)
BUN: 8 mg/dL (ref 6–23)
CALCIUM: 9.1 mg/dL (ref 8.4–10.5)
CO2: 27 mEq/L (ref 19–32)
Chloride: 103 mEq/L (ref 96–112)
Creatinine, Ser: 0.79 mg/dL (ref 0.50–1.10)
GFR calc Af Amer: >90 mL/min (ref >90)
GFR calc non Af Amer: >90 mL/min (ref >90)
Glucose, Bld: 93 mg/dL (ref 70–99)
POTASSIUM: 4.9 meq/L (ref 3.7–5.3)
Sodium: 139 mEq/L (ref 137–147)
Total Bilirubin: 0.5 mg/dL (ref 0.3–1.2)
Total Protein: 6.9 g/dL (ref 6.0–8.3)

## 2013-11-26 LAB — CBC
HCT: 38.8 % (ref 36.0–46.0)
Hemoglobin: 13.2 g/dL (ref 12.0–15.0)
MCH: 32.4 pg (ref 26.0–34.0)
MCHC: 34 g/dL (ref 30.0–36.0)
MCV: 95.1 fL (ref 78.0–100.0)
PLATELETS: 259 10*3/uL (ref 150–400)
RBC: 4.08 MIL/uL (ref 3.87–5.11)
RDW: 13.6 % (ref 11.5–15.5)
WBC: 6.8 10*3/uL (ref 4.0–10.5)

## 2013-11-26 MED ORDER — ACETAMINOPHEN 325 MG PO TABS: 650.0000 mg | ORAL_TABLET | Freq: Four times a day (QID) | ORAL | Status: DC | PRN

## 2013-11-26 MED ORDER — HEPARIN SODIUM (PORCINE) 5000 UNIT/ML IJ SOLN
5000.0000 [IU] | Freq: Three times a day (TID) | INTRAMUSCULAR | Status: DC
  Filled 2013-11-26 (×3): qty 1

## 2013-11-26 MED ORDER — IOHEXOL 300 MG/ML  SOLN: 25.0000 mL | INTRAMUSCULAR | Status: AC

## 2013-11-26 MED ORDER — ACYCLOVIR 400 MG PO TABS
400.0000 mg | ORAL_TABLET | Freq: Two times a day (BID) | ORAL | Status: DC
  Administered 2013-11-26: 400 mg via ORAL
  Filled 2013-11-26 (×2): qty 1

## 2013-11-26 MED ORDER — IOHEXOL 300 MG/ML  SOLN
80.0000 mL | Freq: Once | INTRAMUSCULAR | Status: AC | PRN
  Administered 2013-11-26: 80 mL via INTRAVENOUS

## 2013-11-26 MED ORDER — OXYCODONE HCL 5 MG PO TABS: 5.0000 mg | ORAL_TABLET | ORAL | Status: DC | PRN

## 2013-11-26 MED ORDER — IBUPROFEN 800 MG PO TABS: 400.0000 mg | ORAL_TABLET | Freq: Four times a day (QID) | ORAL | Status: DC | PRN

## 2013-11-26 NOTE — Progress Notes (Signed)
UR completed 

## 2013-11-26 NOTE — Progress Notes (Signed)
1 Day Post-Op  Subjective: She has not had anything for pain, and looks fairly miserable, drinking contrast for the CT scan.  She says she feels better and nurse informs me she has been walking. She really wants some coffee. Objective: Vital signs in last 24 hours: Temp:  [96.5 F (35.8 C)-98.2 F (36.8 C)] 98.2 F (36.8 C) (01/14 0546) Pulse Rate:  [45-74] 70 (01/14 0546) Resp:  [13-19] 16 (01/14 0546) BP: (101-148)/(45-82) 136/82 mmHg (01/14 0546) SpO2:  [100 %] 100 % (01/14 0546) Weight:  [67.132 kg (148 lb)] 67.132 kg (148 lb) (01/13 1742) Last BM Date: 11/24/13 120 PO recorded Afebrile, VSS Labs are all normal so far. Intake/Output from previous day: 01/13 0701 - 01/14 0700 In: 3070 [P.O.:120; I.V.:2950] Out: 3110 [Urine:3100; Blood:10] Intake/Output this shift:    General appearance: alert, cooperative and no distress Resp: clear to auscultation bilaterally GI: soft very sore, dressing are dry, BS hypoactive.  Lab Results:   Recent Labs  11/25/13 0603 11/26/13 0806  WBC 5.2 6.8  HGB 14.2 13.2  HCT 41.3 38.8  PLT 299 259    BMET  Recent Labs  11/25/13 0603 11/26/13 0806  NA 139 139  K 4.7 4.9  CL 102 103  CO2 25 27  GLUCOSE 92 93  BUN 12 8  CREATININE 0.81 0.79  CALCIUM 9.5 9.1   PT/INR No results found for this basename: LABPROT, INR,  in the last 72 hours   Recent Labs Lab 11/25/13 0603 11/26/13 0806  AST 22 37  ALT 19 37*  ALKPHOS 75 57  BILITOT 0.3 0.5  PROT 7.5 6.9  ALBUMIN 3.9 3.7     Lipase     Component Value Date/Time   LIPASE 52 11/25/2013 0603     Studies/Results: Dg Cholangiogram Operative  11/25/2013   CLINICAL DATA:  Cholelithiasis  EXAM: INTRAOPERATIVE CHOLANGIOGRAM  TECHNIQUE: Cholangiographic images from the C-arm fluoroscopic device were submitted for interpretation post-operatively. Please see the procedural report for the amount of contrast and the fluoroscopy time utilized.  COMPARISON:  None.  FINDINGS: No  persistent filling defects in the common duct. Intrahepatic ducts are incompletely visualized, appearing decompressed centrally. Contrast passes into the duodenum.  : Negative for retained common duct stone.   Electronically Signed   By: Arne Cleveland M.D.   On: 11/25/2013 16:57   US Abdomen Complete  11/25/2013   CLINICAL DATA:  Abdominal pain  EXAM: ULTRASOUND ABDOMEN COMPLETE  COMPARISON:  None.  FINDINGS: Gallbladder:  Within the gallbladder, there is sludge with tiny interspersed echogenic foci which move and shadow consistent with gallstones. There is no gallbladder wall thickening or pericholecystic fluid. No sonographic Murphy sign noted.  Common bile duct:  Diameter: 5 mm. There is no intrahepatic, common hepatic, or common bile duct dilatation.  Liver:  No focal lesion identified. Within normal limits in parenchymal echogenicity.  IVC:  No abnormality visualized.  Pancreas:  No mass or inflammatory focus.  Spleen:  Size and appearance within normal limits.  Right Kidney:  Length: 10.5 cm. Echogenicity within normal limits. No mass or hydronephrosis visualized.  Left Kidney:  Length: 11.3 cm. Echogenicity within normal limits. No mass or hydronephrosis visualized.  Abdominal aorta:  No aneurysm visualized.  Other findings:  No appreciable ascites.  IMPRESSION: There is mild sludge with tiny interspersed gallstones in the gallbladder. Study otherwise unremarkable.   Electronically Signed   By: Lowella Grip M.D.   On: 11/25/2013 08:56    Medications: . amphetamine-dextroamphetamine  20 mg Oral Daily    Assessment/Plan 1. Symptomatic Gallstones s/p Laparoscopic cholecystectomy with cholangiogram 11/25/18, Odis Hollingshead, MD        There also appeared to be a firm retrogastric mass in the area of the antrum and pylorus, so a CT scan is  Pending.  2. ADD  3. Tobacco use  4. 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 Remote hx of Migraines  Plan:   Mobilize, ambulate after CT scan.  If CT is ok we will work on getting her home later today. SCD and start heparin later if she does not go home.    LOS: 1 day    Raynie Steinhaus 11/26/2013

## 2013-11-26 NOTE — Progress Notes (Signed)
Her son is here from [place] and will help her at home.

## 2013-11-26 NOTE — Discharge Summary (Signed)
Physician Discharge Summary  Patient ID: Natalie Lara MRN: [id number] DOB/AGE: 1956/11/08 58 y.o.  Admit date: 11/25/2013 Discharge date: 11/26/2013  Admission Diagnoses:  1. Symptomatic Gallstones  2. ADD  3. Tobacco use  4. 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 Remote hx of Migraines   Discharge Diagnoses:  1. Symptomatic Gallstones  2. ADD  3. Tobacco use  4. 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 Remote hx of Migraines 6.  Exophytic uterine fibroid. Difficult to definitively exclude a left ovarian or left adnexal lesion.     Active Problems:   Symptomatic cholelithiasis   PROCEDURES: Laparoscopic cholecystectomy with cholangiogram.  Jackolyn Confer, M.D. 11/25/2013        Hospital Course:  58 y/o in good health, had an episode of acute pain early AM last _0  mL/min (NOTE) The eGFR has been calculated using the CKD EPI equation. This calculation has not been validated in all clinical situations. eGFR's  persistently <90 mL/min signify possible Chronic Kidney Disease.">9090 mL/min (NOTE) The eGFR has been calculated using the CKD EPI equation. This calculation has not been validated in all clinical situations. eGFR's persistently <90 mL/min signify possible Chronic Kidney Disease." border=0 src="[email or web address]:/PROGRAM%20FILES%20([email or web address])/EPIC/V7.9/EN-US/Images/[email or web address]" width=5 height=10  GFR calc Af Amer   Glucose, Bld 92     93  Glucose, Bld   Alkaline Phosphatase 75     57  Alkaline Phosphatase   Albumin 3.9     3.7  Albumin   Lipase 52       Lipase   AST 22     37  AST   ALT 19     37  ALT   Total Protein 7.5     6.9  Total Protein    Total Bilirubin 0.3     0.5  Total Bilirubin    CARDIAC PROFILE     Troponin i, poc   0.00     Troponin i, poc    CBC     WBC 5.2     6.8  WBC   RBC 4.37     4.08  RBC   Hemoglobin 14.2     13.2  Hemoglobin   HCT 41.3     38.8  HCT   MCV 94.5     95.1  MCV   MCH 32.5     32.4  MCH   MCHC 34.4     34.0  MCHC   RDW 13.6     13.6  RDW   Platelets 299     259  Platelets    DIFFERENTIAL             Disposition: Final discharge disposition not confirmed     Medication List         acetaminophen 325 MG tablet  Commonly known as:  TYLENOL  Take 2 tablets (650 mg total) by mouth every 6 (six) hours as needed (Do not take more than 4000 mg of tylenol (acetaminophen) per day.  This is in your pain medicine prescription.).     acyclovir 400 MG tablet  Commonly known as:  ZOVIRAX  Take 400 mg by mouth 2 (two) times daily.     amphetamine-dextroamphetamine 20 MG tablet  Commonly known as:  ADDERALL  Take 20 mg by mouth daily.     calcium citrate-vitamin D 315-200 MG-UNIT per tablet  Commonly known as:  CITRACAL+D  Take 1 tablet by mouth daily.     ibuprofen 200 MG tablet  Commonly known as:  ADVIL,MOTRIN  Take 400 mg by mouth every 6 (six) hours as needed for headache.     LYSINE PO  Take 1 capsule by mouth daily.     oxyCODONE 5 MG immediate release tablet  Commonly known as:  Oxy IR/ROXICODONE  Take 1-2 tablets (5-10 mg total) by mouth every 4 (four) hours as needed for severe pain.       Follow-up Information   Follow up with ROSENBOWER,TODD J, MD. Schedule an appointment as soon as possible for a visit in 2 weeks.   Specialty:  General Surgery   Contact information:   [address] [place] [address] [phone]       Follow up with Call your OB-Gyn doctor and review CT scan with them..      Follow up with Call your primary care doctor and let him know what happened and follow up as needed..      Schedule an appointment  as soon as  possible for a visit with GURLEY,Scott, PA-C. (As needed)    Specialty:  Physician Assistant   Contact information:   [place]  [phone]       Signed: Earnstine Regal 11/26/2013, 1:56 PM

## 2013-11-26 NOTE — Discharge Instructions (Signed)
Laparoscopic Cholecystectomy °Laparoscopic cholecystectomy is surgery to remove the gallbladder. The gallbladder is located in the upper right part of the abdomen, behind the liver. It is a storage sac for bile produced in the liver. Bile aids in the digestion and absorption of fats. Cholecystectomy is often done for inflammation of the gallbladder (cholecystitis). This condition is usually caused by a buildup of gallstones (cholelithiasis) in your gallbladder. Gallstones can block the flow of bile, resulting in inflammation and pain. In severe cases, emergency surgery may be required. When emergency surgery is not required, you will have time to prepare for the procedure. °Laparoscopic surgery is an alternative to open surgery. Laparoscopic surgery has a shorter recovery time. Your common bile duct may also need to be examined during the procedure. If stones are found in the common bile duct, they may be removed. °LET YOUR HEALTH CARE PROVIDER KNOW ABOUT: °· Any allergies you have. °· All medicines you are taking, including vitamins, herbs, eye drops, creams, and over-the-counter medicines. °· Previous problems you or members of your family have had with the use of anesthetics. °· Any blood disorders you have. °· Previous surgeries you have had. °· Medical conditions you have. °RISKS AND COMPLICATIONS °Generally, this is a safe procedure. However, as with any procedure, complications can occur. Possible complications include: °· Infection. °· Damage to the common bile duct, nerves, arteries, veins, or other internal organs such as the stomach, liver, or intestines. °· Bleeding. °· A stone may remain in the common bile duct. °· A bile leak from the cyst duct that is clipped when your gallbladder is removed. °· The need to convert to open surgery, which requires a larger incision in the abdomen. This may be necessary if your surgeon thinks it is not safe to continue with a laparoscopic procedure. °BEFORE THE  PROCEDURE °· Ask your health care provider about changing or stopping any regular medicines. You will need to stop taking aspirin or blood thinners at least 5 days prior to surgery. °· Do not eat or drink anything after midnight the night before surgery. °· Let your health care provider know if you develop a cold or other infectious problem before surgery. °PROCEDURE  °· You will be given medicine to make you sleep through the procedure (general anesthetic). A breathing tube will be placed in your mouth. °· When you are asleep, your surgeon will make several small cuts (incisions) in your abdomen. °· A thin, lighted tube with a tiny camera on the end (laparoscope) is inserted through one of the small incisions. The camera on the laparoscope sends a picture to a TV screen in the operating room. This gives the surgeon a good view inside your abdomen. °· A gas will be pumped into your abdomen. This expands your abdomen so that the surgeon has more room to perform the surgery. °· Other tools needed for the procedure are inserted through the other incisions. The gallbladder is removed through one of the incisions. °· After the removal of your gallbladder, the incisions will be closed with stitches, staples, or skin glue. °AFTER THE PROCEDURE °· You will be taken to a recovery area where your progress will be checked often. °· You may be allowed to go home the same day if your pain is controlled and you can tolerate liquids. °Document Released: 10/30/2005 Document Revised: 08/20/2013 Document Reviewed: 06/11/2013 °ExitCare® Patient Information ©2014 [employer]. ° °CCS ______[facility] °LAPAROSCOPIC SURGERY: POST OP INSTRUCTIONS °Always review your discharge instruction sheet   given to you by the facility where your surgery was performed. °IF YOU HAVE DISABILITY OR FAMILY LEAVE FORMS, YOU MUST BRING THEM TO THE OFFICE FOR PROCESSING.   °DO NOT GIVE THEM TO YOUR DOCTOR. ° °1. A prescription for pain  medication may be given to you upon discharge.  Take your pain medication as prescribed, if needed.  If narcotic pain medicine is not needed, then you may take acetaminophen (Tylenol) or ibuprofen (Advil) as needed. °2. Take your usually prescribed medications unless otherwise directed. °3. If you need a refill on your pain medication, please contact your pharmacy.  They will contact our office to request authorization. Prescriptions will not be filled after 5pm or on week-ends. °4. You should follow a light diet the first few days after arrival home, such as soup and crackers, etc.  Be sure to include lots of fluids daily. °5. Most patients will experience some swelling and bruising in the area of the incisions.  Ice packs will help.  Swelling and bruising can take several days to resolve.  °6. It is common to experience some constipation if taking pain medication after surgery.  Increasing fluid intake and taking a stool softener (such as Colace) will usually help or prevent this problem from occurring.  A mild laxative (Milk of Magnesia or Miralax) should be taken according to package instructions if there are no bowel movements after 48 hours. °7. Unless discharge instructions indicate otherwise, you may remove your bandages 24-48 hours after surgery, and you may shower at that time.  You may have steri-strips (small skin tapes) in place directly over the incision.  These strips should be left on the skin for 7-10 days.  If your surgeon used skin glue on the incision, you may shower in 24 hours.  The glue will flake off over the next 2-3 weeks.  Any sutures or staples will be removed at the office during your follow-up visit. °8. ACTIVITIES:  You may resume regular (light) daily activities beginning the next day--such as daily self-care, walking, climbing stairs--gradually increasing activities as tolerated.  You may have sexual intercourse when it is comfortable.  Refrain from any heavy lifting or straining  until approved by your doctor. °a. You may drive when you are no longer taking prescription pain medication, you can comfortably wear a seatbelt, and you can safely maneuver your car and apply brakes. °b. RETURN TO WORK:  __________________________________________________________ °9. You should see your doctor in the office for a follow-up appointment approximately 2-3 weeks after your surgery.  Make sure that you call for this appointment within a day or two after you arrive home to insure a convenient appointment time. °10. OTHER INSTRUCTIONS: __________________________________________________________________________________________________________________________ __________________________________________________________________________________________________________________________ °WHEN TO CALL YOUR DOCTOR: °1. Fever over 101.0 °2. Inability to urinate °3. Continued bleeding from incision. °4. Increased pain, redness, or drainage from the incision. °5. Increasing abdominal pain ° °The clinic staff is available to answer your questions during regular business hours.  Please don’t hesitate to call and ask to speak to one of the nurses for clinical concerns.  If you have a medical emergency, go to the nearest emergency room or call 911.  A surgeon from [facility] is always on call at the hospital. °[address], [address], [place], [place]  [address] ? [address] [address], , [place]   [address] °[phone] ? [phone] ? FAX [phone] °Web site: [email or web address] ° °

## 2013-11-26 NOTE — Progress Notes (Signed)
Patient discharged to home with family via wheelchair, discharge instructions reviewed with patient who verbalized understanding. New RX's given to patient. 

## 2013-11-26 NOTE — Discharge Summary (Signed)
Agree with summary. 

## 2013-12-04 ENCOUNTER — Other Ambulatory Visit: Payer: Self-pay | Admitting: Obstetrics & Gynecology

## 2013-12-04 ENCOUNTER — Other Ambulatory Visit (HOSPITAL_COMMUNITY)
Admission: RE | Admit: 2013-12-04 | Discharge: 2013-12-04 | Disposition: A | Discharge status: Home / Self Care | Payer: 59 | Source: Ambulatory Visit | Attending: Obstetrics & Gynecology | Admitting: Obstetrics & Gynecology

## 2013-12-04 DIAGNOSIS — Z1151 Encounter for screening for human papillomavirus (HPV): Secondary | ICD-10-CM | POA: Insufficient documentation

## 2013-12-04 DIAGNOSIS — Z01419 Encounter for gynecological examination (general) (routine) without abnormal findings: Secondary | ICD-10-CM | POA: Insufficient documentation

## 2013-12-05 ENCOUNTER — Encounter (INDEPENDENT_AMBULATORY_CARE_PROVIDER_SITE_OTHER): Payer: Self-pay

## 2013-12-05 ENCOUNTER — Telehealth (INDEPENDENT_AMBULATORY_CARE_PROVIDER_SITE_OTHER): Payer: Self-pay

## 2013-12-05 NOTE — Telephone Encounter (Signed)
Attempted to reach the patient regarding RTW note.  Both home and work numbers were disconnected.

## 2013-12-15 ENCOUNTER — Ambulatory Visit (INDEPENDENT_AMBULATORY_CARE_PROVIDER_SITE_OTHER): Payer: 59 | Admitting: General Surgery

## 2013-12-15 ENCOUNTER — Encounter (INDEPENDENT_AMBULATORY_CARE_PROVIDER_SITE_OTHER): Payer: Self-pay | Admitting: General Surgery

## 2013-12-15 VITALS — BP 126/78 | HR 70 | Resp 16 | Ht 62.0 in | Wt 148.0 lb

## 2013-12-15 DIAGNOSIS — Z4889 Encounter for other specified surgical aftercare: Secondary | ICD-10-CM

## 2013-12-15 NOTE — Progress Notes (Signed)
Procedure:  Laparoscopic cholecystectomy with cholangiogram  Date:  11/26/2013  Pathology:  Chronic cholecystitis  History:  She is here for her first postoperative visit. She saw a gynecologist and is felt to have an ovarian cyst that can be monitored. She has some umbilical soreness but otherwise is doing well.  Exam: General- Is in NAD. Abdomen-soft, incisions are clean and intact.  Assessment:  Satisfactory recovery post laparoscopic cholecystectomy.  Plan:  Low-fat diet recommended. Activities as tolerated. Return visit as needed.

## 2013-12-15 NOTE — Patient Instructions (Signed)
Stay on a low-fat diet. Exercise. Activities as tolerated.

## 2014-12-11 ENCOUNTER — Other Ambulatory Visit: Payer: Self-pay | Admitting: Obstetrics & Gynecology

## 2014-12-11 ENCOUNTER — Other Ambulatory Visit (HOSPITAL_COMMUNITY)
Admission: RE | Admit: 2014-12-11 | Discharge: 2014-12-11 | Disposition: A | Discharge status: Home / Self Care | Payer: 59 | Source: Ambulatory Visit | Attending: Obstetrics and Gynecology | Admitting: Obstetrics and Gynecology

## 2014-12-11 DIAGNOSIS — Z01419 Encounter for gynecological examination (general) (routine) without abnormal findings: Secondary | ICD-10-CM | POA: Insufficient documentation

## 2014-12-15 LAB — CYTOLOGY - PAP

## 2015-03-03 ENCOUNTER — Emergency Department (HOSPITAL_COMMUNITY)
Admission: EM | Admit: 2015-03-03 | Discharge: 2015-03-03 | Disposition: A | Discharge status: Home / Self Care | Payer: 59 | Attending: Emergency Medicine | Admitting: Emergency Medicine

## 2015-03-03 ENCOUNTER — Encounter (HOSPITAL_COMMUNITY): Payer: Self-pay

## 2015-03-03 ENCOUNTER — Emergency Department (HOSPITAL_COMMUNITY): Payer: 59

## 2015-03-03 DIAGNOSIS — Z720 Tobacco use: Secondary | ICD-10-CM | POA: Insufficient documentation

## 2015-03-03 DIAGNOSIS — E041 Nontoxic single thyroid nodule: Secondary | ICD-10-CM | POA: Insufficient documentation

## 2015-03-03 DIAGNOSIS — F909 Attention-deficit hyperactivity disorder, unspecified type: Secondary | ICD-10-CM | POA: Diagnosis not present

## 2015-03-03 DIAGNOSIS — K1120 Sialoadenitis, unspecified: Secondary | ICD-10-CM | POA: Diagnosis not present

## 2015-03-03 DIAGNOSIS — Z79899 Other long term (current) drug therapy: Secondary | ICD-10-CM | POA: Diagnosis not present

## 2015-03-03 DIAGNOSIS — R221 Localized swelling, mass and lump, neck: Secondary | ICD-10-CM

## 2015-03-03 DIAGNOSIS — R220 Localized swelling, mass and lump, head: Secondary | ICD-10-CM | POA: Diagnosis present

## 2015-03-03 LAB — CBC WITH DIFFERENTIAL/PLATELET
BASOS ABS: 0 10*3/uL (ref 0.0–0.1)
Basophils Relative: 1 % (ref 0–1)
Eosinophils Absolute: 0.2 10*3/uL (ref 0.0–0.7)
Eosinophils Relative: 3 % (ref 0–5)
HEMATOCRIT: 41.6 % (ref 36.0–46.0)
Hemoglobin: 14.1 g/dL (ref 12.0–15.0)
LYMPHS PCT: 28 % (ref 12–46)
Lymphs Abs: 1.7 10*3/uL (ref 0.7–4.0)
MCH: 33.2 pg (ref 26.0–34.0)
MCHC: 33.9 g/dL (ref 30.0–36.0)
MCV: 97.9 fL (ref 78.0–100.0)
Monocytes Absolute: 0.6 10*3/uL (ref 0.1–1.0)
Monocytes Relative: 10 % (ref 3–12)
NEUTROS ABS: 3.6 10*3/uL (ref 1.7–7.7)
Neutrophils Relative %: 58 % (ref 43–77)
PLATELETS: 262 10*3/uL (ref 150–400)
RBC: 4.25 MIL/uL (ref 3.87–5.11)
RDW: 13 % (ref 11.5–15.5)
WBC: 6.2 10*3/uL (ref 4.0–10.5)

## 2015-03-03 LAB — BASIC METABOLIC PANEL
Anion gap: 3 — ABNORMAL LOW (ref 5–15)
BUN: 17 mg/dL (ref 6–23)
CO2: 25 mmol/L (ref 19–32)
CREATININE: 0.77 mg/dL (ref 0.50–1.10)
Calcium: 9.2 mg/dL (ref 8.4–10.5)
Chloride: 106 mmol/L (ref 96–112)
GFR calc Af Amer: >90 mL/min (ref >90)
GFR calc non Af Amer: >90 mL/min (ref >90)
GLUCOSE: 99 mg/dL (ref 70–99)
Potassium: 4.3 mmol/L (ref 3.5–5.1)
SODIUM: 134 mmol/L — AB (ref 135–145)

## 2015-03-03 MED ORDER — IOHEXOL 300 MG/ML  SOLN
80.0000 mL | Freq: Once | INTRAMUSCULAR | Status: AC | PRN
  Administered 2015-03-03: 80 mL via INTRAVENOUS

## 2015-03-03 MED ORDER — KETOROLAC TROMETHAMINE 30 MG/ML IJ SOLN
30.0000 mg | Freq: Once | INTRAMUSCULAR | Status: AC
  Administered 2015-03-03: 30 mg via INTRAVENOUS
  Filled 2015-03-03: qty 1

## 2015-03-03 NOTE — ED Provider Notes (Signed)
CSN: [id number]     Arrival date & time 03/03/15  1015 History   First MD Initiated Contact with Patient 03/03/15 1050     Chief Complaint  Patient presents with  . Oral Swelling     (Consider location/radiation/quality/duration/timing/severity/associated sxs/prior Treatment) HPI Comments: Patient with NO history of diabetes or immunocompromise -- presents with complaint of swelling underneath her right jaw beginning acutely last evening after she ate some peanuts. Patient has never had an allergy to nuts in the past. Patient went to an outside urgent care where she was told that she was not having an allergic reaction. She was prescribed naproxen, hydrocodone, Augmentin which she has been taking. These have not helped. This morning the swelling is worse. She has extension of pain into her right anterior neck. Patient states that she was unable to eat oatmeal because the pain is so severe with swallowing. She states that her voice is hoarse. No regurgitation or vomiting. Patient states that the pain is worse when she opens her mouth or palpates the outside of her neck. No difficulty breathing at this time. No fever. No chest pain or abdominal pain. Course is constant. Nothing makes symptoms better.  The history is provided by the patient.    Past Medical History  Diagnosis Date  . ADD (attention deficit disorder)    Past Surgical History  Procedure Laterality Date  . Tubal ligation    . Cholecystectomy N/A 11/25/2013    Procedure: LAPAROSCOPIC CHOLECYSTECTOMY WITH INTRAOPERATIVE CHOLANGIOGRAM;  Surgeon: Odis Hollingshead, MD;  Location: [facility];  Service: General;  Laterality: N/A;   History reviewed. No pertinent family history. History  Substance Use Topics  . Smoking status: Current Every Day Smoker -- 1.00 packs/day    Types: Cigarettes  . Smokeless tobacco: Never Used  . Alcohol Use: Yes     Comment: rare   OB History    No data available     Review of Systems    Constitutional: Negative for fever.  HENT: Positive for trouble swallowing and voice change. Negative for dental problem, ear pain, facial swelling, rhinorrhea and sore throat.   Eyes: Negative for redness.  Respiratory: Negative for cough, shortness of breath and stridor.   Cardiovascular: Negative for chest pain.  Gastrointestinal: Negative for nausea, vomiting, abdominal pain and diarrhea.  Genitourinary: Negative for dysuria.  Musculoskeletal: Positive for neck pain. Negative for myalgias.  Skin: Negative for color change and rash.  Neurological: Negative for headaches.      Allergies  Other  Home Medications   Prior to Admission medications   Medication Sig Start Date End Date Taking? Authorizing Provider  acetaminophen (TYLENOL) 325 MG tablet Take 2 tablets (650 mg total) by mouth every 6 (six) hours as needed (Do not take more than 4000 mg of tylenol (acetaminophen) per day.  This is in your pain medicine prescription.). 11/26/13   Earnstine Regal, PA-C  acyclovir (ZOVIRAX) 400 MG tablet Take 400 mg by mouth 2 (two) times daily.    Historical Provider, MD  amphetamine-dextroamphetamine (ADDERALL) 20 MG tablet Take 20 mg by mouth daily.    Historical Provider, MD  calcium citrate-vitamin D (CITRACAL+D) 315-200 MG-UNIT per tablet Take 1 tablet by mouth daily.    Historical Provider, MD  ibuprofen (ADVIL,MOTRIN) 200 MG tablet Take 400 mg by mouth every 6 (six) hours as needed for headache.    Historical Provider, MD  LYSINE PO Take 1 capsule by mouth daily.    Historical Provider, MD  oxyCODONE (  OXY IR/ROXICODONE) 5 MG immediate release tablet Take 1-2 tablets (5-10 mg total) by mouth every 4 (four) hours as needed for severe pain. 11/26/13   Earnstine Regal, PA-C   BP 151/94 mmHg  Pulse 96  Temp(Src) 98.1 F (36.7 C) (Oral)  Resp 20  SpO2 100%  LMP 01/25/2015 Physical Exam  Constitutional: She appears well-developed and well-nourished.  HENT:  Head: Normocephalic and  atraumatic.  Mouth/Throat: Oropharynx is clear and moist and mucous membranes are normal. No oral lesions. There is trismus in the jaw. No dental abscesses or uvula swelling.  No dental abscess or obvious dental related source noted.   Eyes: Conjunctivae are normal. Right eye exhibits no discharge. Left eye exhibits no discharge.  Neck: Normal range of motion. Tracheal tenderness present. No tracheal deviation present.    Cardiovascular: Normal rate, regular rhythm and normal heart sounds.   Pulmonary/Chest: Effort normal and breath sounds normal. No stridor. No respiratory distress. She has no wheezes. She has no rales.  Abdominal: Soft. There is no tenderness.  Lymphadenopathy:    She has no cervical adenopathy.  Neurological: She is alert.  Skin: Skin is warm and dry.  Psychiatric: She has a normal mood and affect.  Nursing note and vitals reviewed.   ED Course  Procedures (including critical care time) Labs Review Labs Reviewed  BASIC METABOLIC PANEL - Abnormal; Notable for the following:    Sodium 134 (*)    Anion gap 3 (*)    All other components within normal limits  CBC WITH DIFFERENTIAL/PLATELET    Imaging Review Ct Soft Tissue Neck W Contrast  03/03/2015   CLINICAL DATA:  Sore throat and swelling.  EXAM: CT NECK WITH CONTRAST  TECHNIQUE: Multidetector CT imaging of the neck was performed using the standard protocol following the bolus administration of intravenous contrast. The patient did have extravasation of wall and 30 40 cc of Omnipaque 300 into the left antecubital fossa.  CONTRAST:  34mL OMNIPAQUE IOHEXOL 300 MG/ML  SOLN  COMPARISON:  None.  FINDINGS: Pharynx and larynx: Normal.  Salivary glands: There is abnormal enlargement and enhancement of the right submandibular gland and of the right sublingual gland. There is soft tissue stranding around the right submandibular gland. The ducts of those glands are not dilated. There is no visible floor of mouth mass or evidence  of stones in the ducts.  The left submandibular gland and sublingual gland are normal. Parotid glands are normal.  Thyroid: There is a inhomogeneous primarily solid 2.7 cm dominant nodule in the left lobe of the thyroid gland. There other smaller nodules in both lobes as well as in the isthmus.  Lymph nodes: No adenopathy.  Vascular: Normal.  Limited intracranial: Normal.  Visualized orbits: Normal.  Mastoids and visualized paranasal sinuses: Normal.  Skeleton: No acute abnormality. Degenerative disc disease at C5-6 with disc space narrowing and slight bilateral foraminal narrowing.  Upper chest: Normal.  IMPRESSION: 1. Inflammation of the right submandibular gland and right sublingual gland. Adjacent soft tissue stranding around the right submandibular gland. This probably represents infectious sialadenitis. There is no visible floor of mouth lesion but given the concurrent inflammation, the possibility of a mucosal mass in the floor of the bowel should be considered. 2. Dominant primarily solid nodule in the left lobe of the thyroid gland. Multinodular goiter. If the thyroid gland has not been previously evaluated, I recommend thyroid ultrasound for further evaluation.   Electronically Signed   By: Lorriane Shire M.D.   On:  03/03/2015 13:37     EKG Interpretation None      10:52 AM Patient seen and examined. Work-up initiated.    Vital signs reviewed and are as follows: BP 151/94 mmHg  Pulse 96  Temp(Src) 98.1 F (36.7 C) (Oral)  Resp 20  SpO2 100%  LMP 01/25/2015  1:38 PM Pending CT results. Lab unremarkable. Patient is having no pain at site of infiltration. No tenderness, swelling, redness. Full ROM elbow. Normal distal sensation and pulses.   2:23 PM Pt informed of results. Arm and neck exam unchanged. Patient instructed to continue Augmentin, Vicodin, naproxen as previously prescribed. She was given ENT follow-up. Patient was informed that the CT scan shows thyroid nodules which was to be  evaluated by her primary care physician. Patient encouraged to return with worsening sitting symptoms, fever, trouble breathing or swallowing, or other concerns.  MDM   Final diagnoses:  Neck swelling  Sialadenitis  Thyroid nodule   Patient with findings as above. She appears well, non-toxic. No airway compromise. No sepsis. ENT f/u given. She is on appropriate therapy given by PCP.     Carlisle Cater, PA-C 03/03/15 [place], MD 03/03/15 [phone]

## 2015-03-03 NOTE — ED Notes (Signed)
Patient transported to CT 

## 2015-03-03 NOTE — ED Notes (Signed)
Pt CT brought pt back to hallway A. CT tech stated that her IV infiltrated and about 30 ML of contrast is in her left AC area.  Pt denying any pain to Ct tech.  Made Josh PA aware.  Verbal instruction to elevate arm.

## 2015-03-03 NOTE — ED Notes (Signed)
Pt states sore throat with throat swelling.  Pt ate nuts yesterday and soon after noted rt neck swelling. Pt went to urgent care last night.  Told it wasn't the nuts and gave her antibiotics.  Pt here with continued swelling.  Airway maintained.  No fever.

## 2015-03-03 NOTE — Discharge Instructions (Signed)
Please read and follow all provided instructions.  Your diagnoses today include:  1. Sialadenitis   2. Neck swelling   3. Thyroid nodule     Tests performed today include:  Blood counts and electrolytes - no concerning results  CT scan - shows infection in the salivary gland with inflammation surrounding  Vital signs. See below for your results today.   Medications prescribed:   None  Take any prescribed medications only as directed.  Home care instructions:  Follow any educational materials contained in this packet.  Continue the hydrocodone, Augmentin (antibiotic), and naproxen given to you previously.   BE VERY CAREFUL not to take multiple medicines containing Tylenol (also called acetaminophen). Doing so can lead to an overdose which can damage your liver and cause liver failure and possibly death.   Follow-up instructions: Please follow-up with the ENT listed in 3 days for further evaluation of your symptoms.   You will need to have your primary care doctor address the thyroid nodules seen on your CT in the next few weeks.   Return instructions:   Please return to the Emergency Department if you experience worsening symptoms.   Return with fever, worsening pain, redness of the face, trouble breathing or swallowing  Please return if you have any other emergent concerns.  Additional Information:  Your vital signs today were: BP 141/86 mmHg   Pulse 82   Temp(Src) 98.1 F (36.7 C) (Oral)   Resp 17   SpO2 99%   LMP 01/25/2015 If your blood pressure (BP) was elevated above 135/85 this visit, please have this repeated by your doctor within one month. --------------

## 2015-03-08 ENCOUNTER — Other Ambulatory Visit: Payer: Self-pay | Admitting: Otolaryngology

## 2015-03-08 DIAGNOSIS — E041 Nontoxic single thyroid nodule: Secondary | ICD-10-CM

## 2015-03-08 DIAGNOSIS — D34 Benign neoplasm of thyroid gland: Secondary | ICD-10-CM

## 2015-03-17 ENCOUNTER — Other Ambulatory Visit (HOSPITAL_COMMUNITY)
Admission: RE | Admit: 2015-03-17 | Discharge: 2015-03-17 | Disposition: A | Discharge status: Home / Self Care | Payer: 59 | Source: Ambulatory Visit | Attending: Interventional Radiology | Admitting: Interventional Radiology

## 2015-03-17 ENCOUNTER — Ambulatory Visit
Admission: RE | Admit: 2015-03-17 | Discharge: 2015-03-17 | Disposition: A | Discharge status: Home / Self Care | Payer: 59 | Source: Ambulatory Visit | Attending: Otolaryngology | Admitting: Otolaryngology

## 2015-03-17 DIAGNOSIS — E041 Nontoxic single thyroid nodule: Secondary | ICD-10-CM | POA: Insufficient documentation

## 2015-03-17 DIAGNOSIS — D34 Benign neoplasm of thyroid gland: Secondary | ICD-10-CM

## 2015-07-22 ENCOUNTER — Other Ambulatory Visit: Payer: Self-pay | Admitting: Geriatric Medicine

## 2015-07-22 DIAGNOSIS — D34 Benign neoplasm of thyroid gland: Secondary | ICD-10-CM

## 2015-08-04 ENCOUNTER — Other Ambulatory Visit: Payer: Self-pay | Admitting: Otolaryngology

## 2015-08-04 ENCOUNTER — Ambulatory Visit
Admission: RE | Admit: 2015-08-04 | Discharge: 2015-08-04 | Disposition: A | Discharge status: Home / Self Care | Payer: 59 | Source: Ambulatory Visit | Attending: Geriatric Medicine | Admitting: Geriatric Medicine

## 2015-08-04 DIAGNOSIS — D34 Benign neoplasm of thyroid gland: Secondary | ICD-10-CM

## 2015-12-18 IMAGING — US US THYROID BIOPSY
1 series · 14 of 15 positions shown · non-contrast
Comparison: 03/03/2015

CLINICAL DATA: Dominant left lower pole thyroid mass

EXAM:
ULTRASOUND GUIDED NEEDLE ASPIRATE BIOPSY OF THE THYROID GLAND

[Series 1: us thyroid biopsy · 0.08mm/px · 15 acquisitions, 14 frames shown]
[im 1/15]
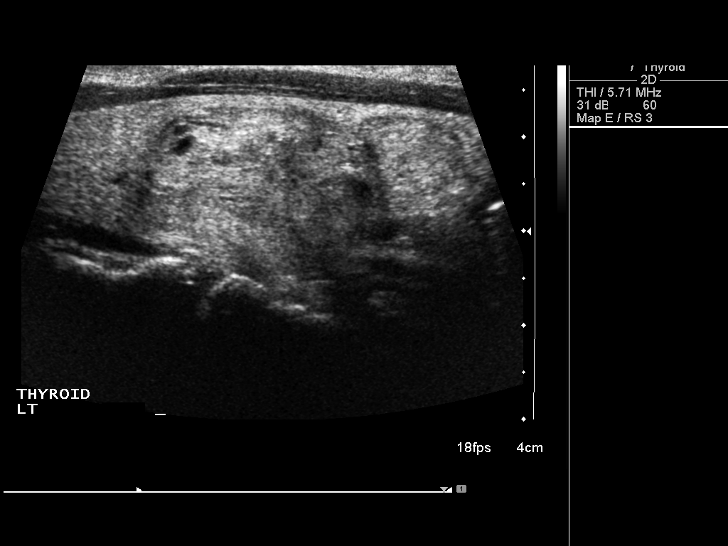
[im 2/15]
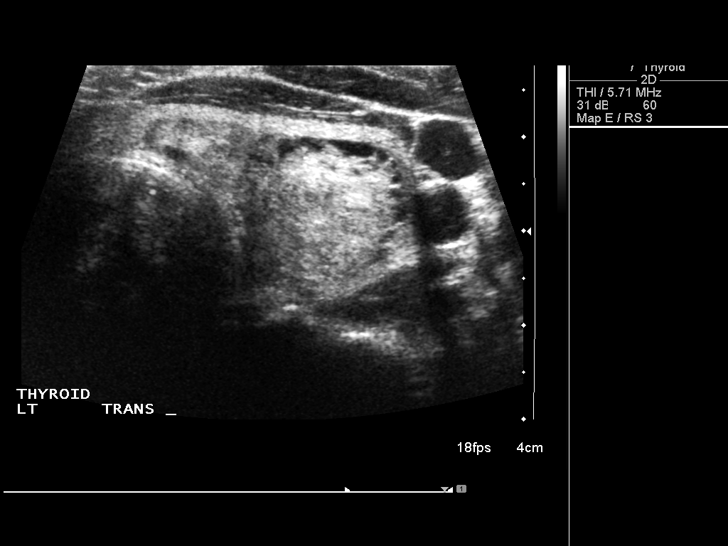
[im 3/15]
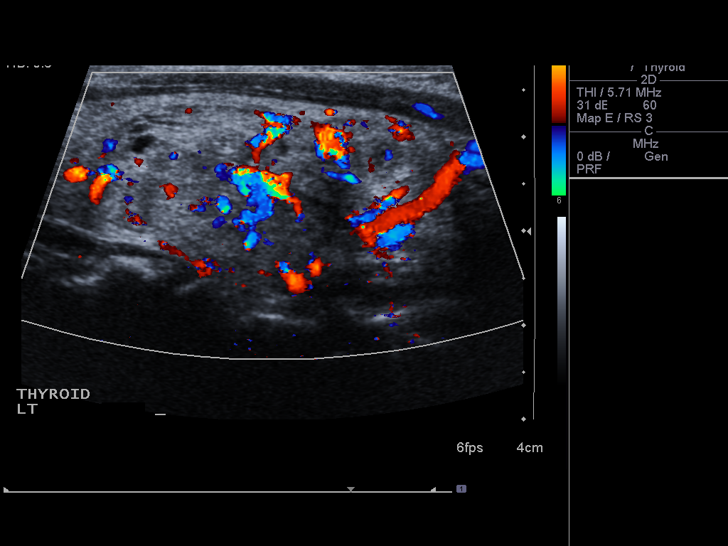
[im 4/15]
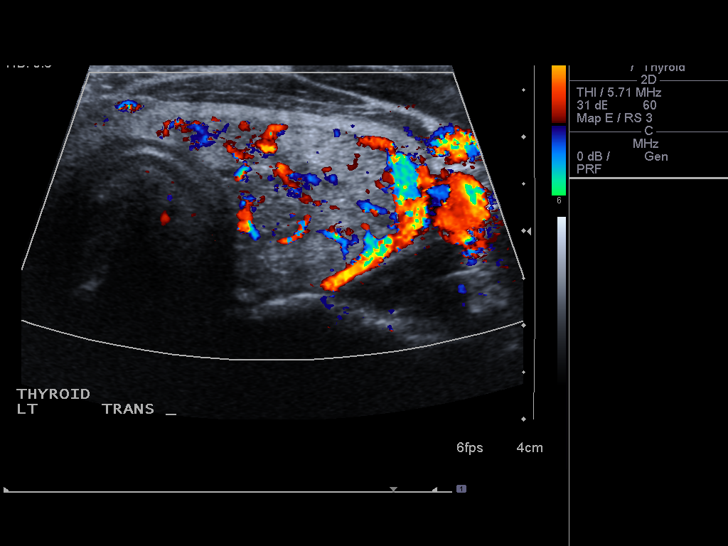
[im 5/15]
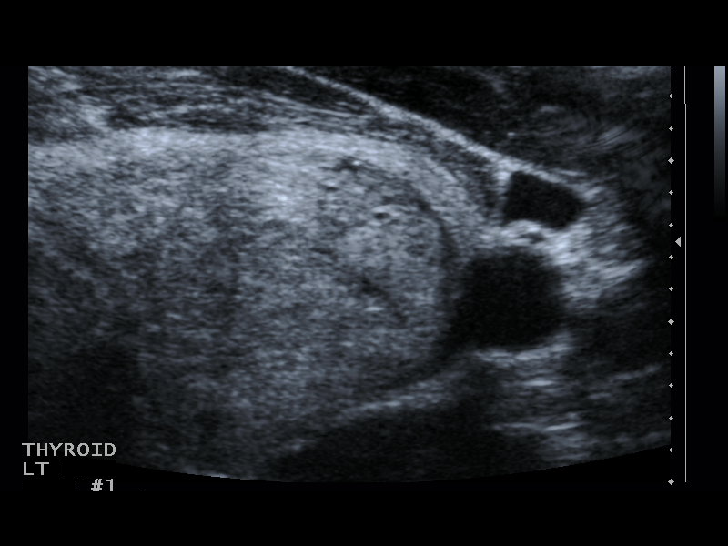
[im 6/15]
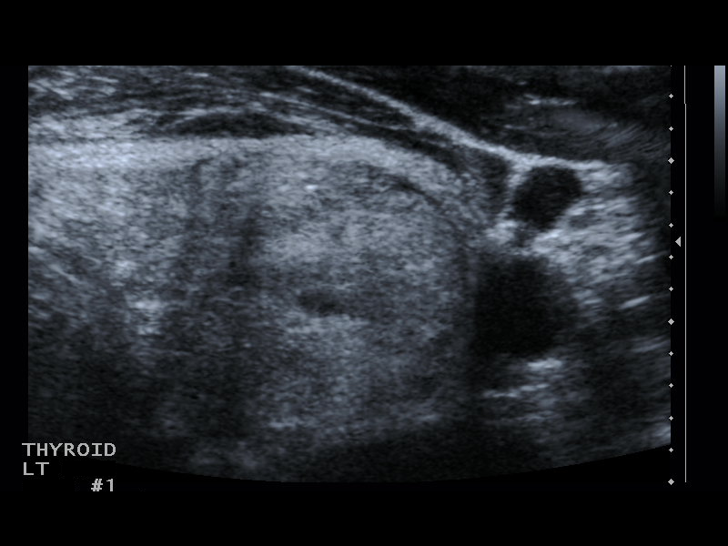
[im 7/15]
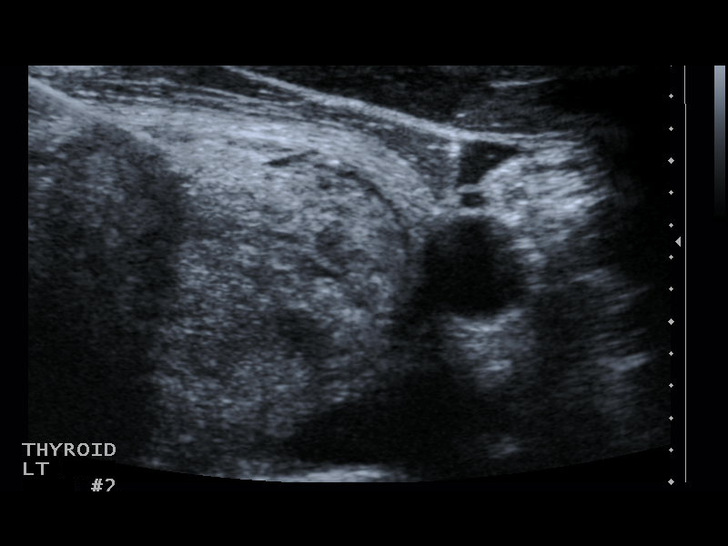
[im 9/15]
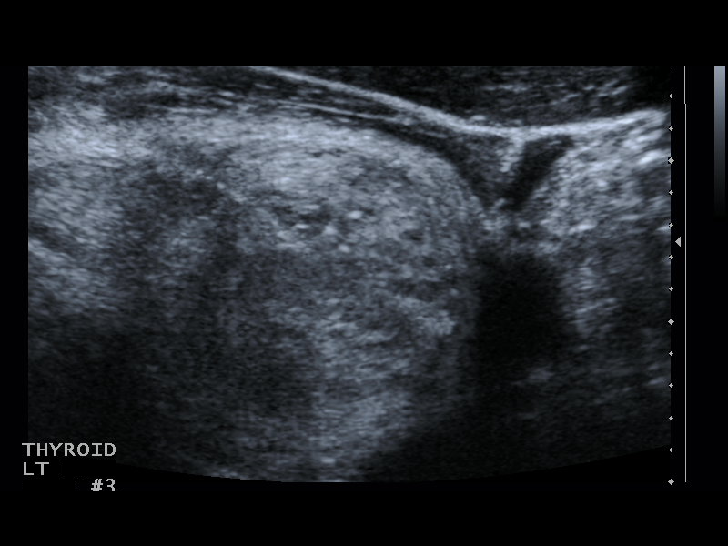
[im 10/15]
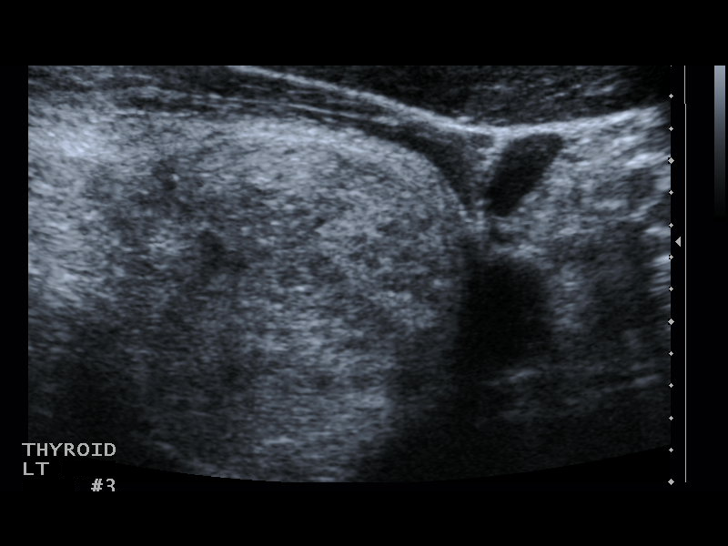
[im 11/15]
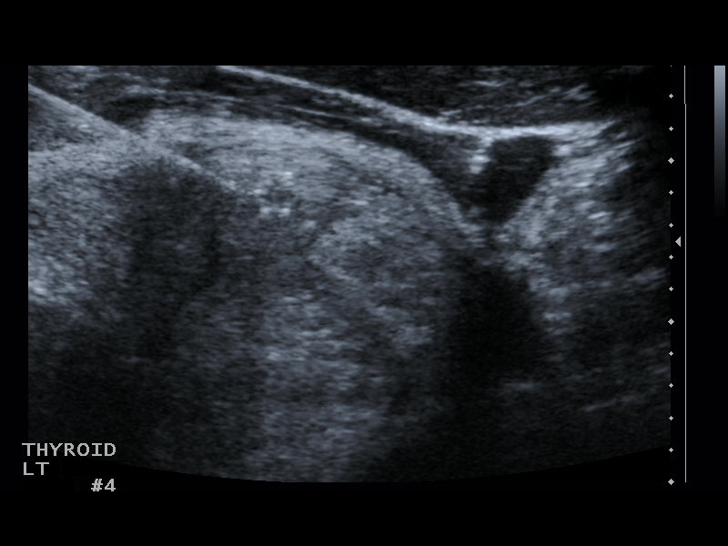
[im 12/15]
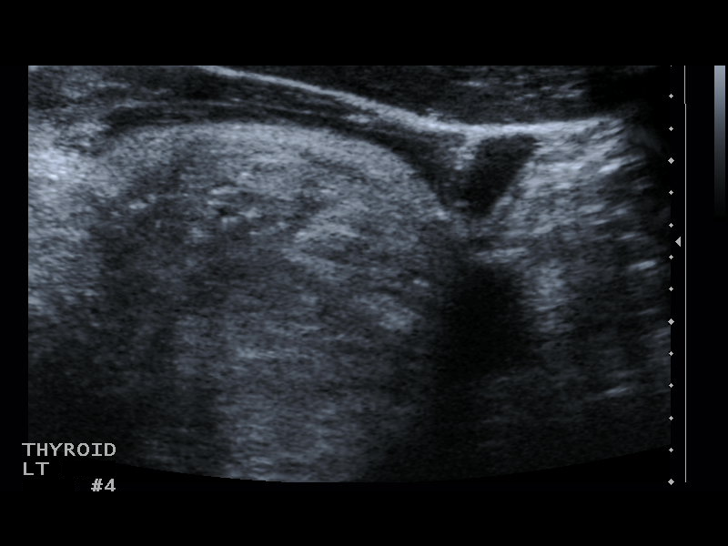
[im 13/15]
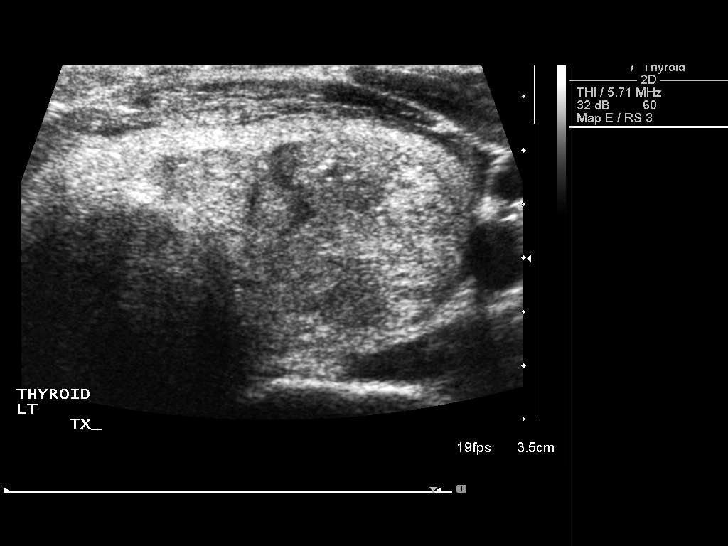
[im 14/15]
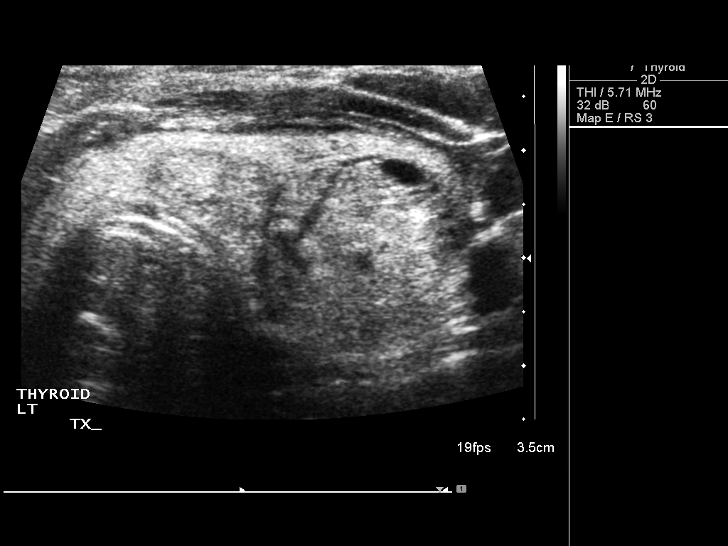
[im 15/15]
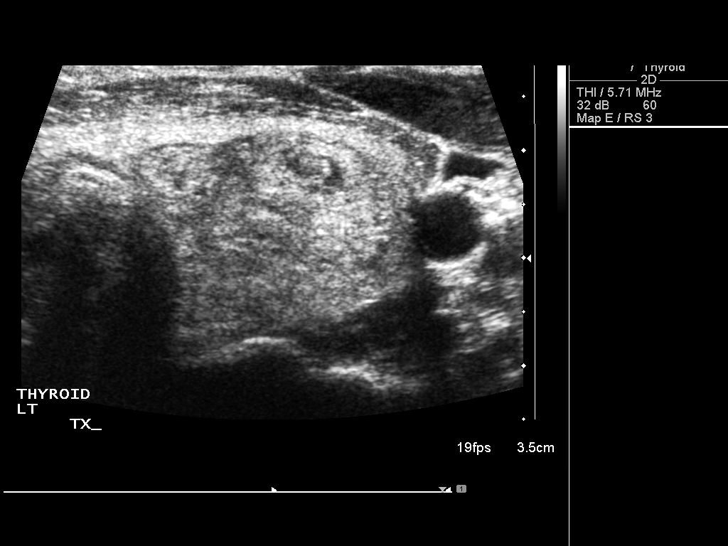

[14 of 15 positions shown; findings below may reference images not displayed]

PROCEDURE:
Thyroid biopsy was thoroughly discussed with the patient and
questions were answered. The benefits, risks, alternatives, and
complications were also discussed. The patient understands and
wishes to proceed with the procedure. Written consent was obtained.



Complications:  None immediate
FINDINGS: Imaging confirms needle placed in the dominant left inferior thyroid
nodule
IMPRESSION: Ultrasound guided needle aspirate biopsy performed of the dominant
left inferior thyroid nodule.

## 2016-01-22 ENCOUNTER — Ambulatory Visit (INDEPENDENT_AMBULATORY_CARE_PROVIDER_SITE_OTHER): Payer: 59 | Admitting: Family Medicine

## 2016-01-22 VITALS — BP 102/64 | HR 85 | Temp 98.2°F | Resp 16 | Ht 62.0 in | Wt 148.0 lb

## 2016-01-22 DIAGNOSIS — R5383 Other fatigue: Secondary | ICD-10-CM | POA: Diagnosis not present

## 2016-01-22 DIAGNOSIS — J209 Acute bronchitis, unspecified: Secondary | ICD-10-CM

## 2016-01-22 DIAGNOSIS — R070 Pain in throat: Secondary | ICD-10-CM

## 2016-01-22 DIAGNOSIS — F17200 Nicotine dependence, unspecified, uncomplicated: Secondary | ICD-10-CM

## 2016-01-22 DIAGNOSIS — J069 Acute upper respiratory infection, unspecified: Secondary | ICD-10-CM

## 2016-01-22 DIAGNOSIS — R05 Cough: Secondary | ICD-10-CM

## 2016-01-22 LAB — POCT CBC
GRANULOCYTE PERCENT: 57.4 % (ref 37–80)
HCT, POC: 38.6 % (ref 37.7–47.9)
Hemoglobin: 13.8 g/dL (ref 12.2–16.2)
Lymph, poc: 1.7 (ref 0.6–3.4)
MCH: 34.1 pg — AB (ref 27–31.2)
MCHC: 35.7 g/dL — AB (ref 31.8–35.4)
MCV: 95.4 fL (ref 80–97)
MID (cbc): 0.6 (ref 0–0.9)
MPV: 6.8 fL (ref 0–99.8)
POC Granulocyte: 3.1 (ref 2–6.9)
POC LYMPH PERCENT: 31.9 %L (ref 10–50)
POC MID %: 10.7 % (ref 0–12)
Platelet Count, POC: 264 10*3/uL (ref 142–424)
RBC: 4.05 M/uL (ref 4.04–5.48)
RDW, POC: 13.2 %
WBC: 5.4 10*3/uL (ref 4.6–10.2)

## 2016-01-22 LAB — POCT INFLUENZA A/B
Influenza A, POC: NEGATIVE
Influenza B, POC: NEGATIVE

## 2016-01-22 LAB — POCT RAPID STREP A (OFFICE): Rapid Strep A Screen: NEGATIVE

## 2016-01-22 MED ORDER — ALBUTEROL SULFATE HFA 108 (90 BASE) MCG/ACT IN AERS: 2.0000 | INHALATION_SPRAY | RESPIRATORY_TRACT | Status: AC | PRN

## 2016-01-22 MED ORDER — ALBUTEROL SULFATE (2.5 MG/3ML) 0.083% IN NEBU
2.5000 mg | INHALATION_SOLUTION | Freq: Once | RESPIRATORY_TRACT | Status: AC
  Administered 2016-01-22: 2.5 mg via RESPIRATORY_TRACT

## 2016-01-22 MED ORDER — IPRATROPIUM BROMIDE 0.02 % IN SOLN
0.5000 mg | Freq: Once | RESPIRATORY_TRACT | Status: AC
  Administered 2016-01-22: 0.5 mg via RESPIRATORY_TRACT

## 2016-01-22 MED ORDER — GUAIFENESIN ER 1200 MG PO TB12: 1.0000 | ORAL_TABLET | Freq: Two times a day (BID) | ORAL | Status: AC | PRN

## 2016-01-22 MED ORDER — HYDROCODONE-HOMATROPINE 5-1.5 MG/5ML PO SYRP: 5.0000 mL | ORAL_SOLUTION | Freq: Three times a day (TID) | ORAL | Status: AC | PRN

## 2016-01-22 NOTE — Patient Instructions (Addendum)
   IF you received an x-ray today, you will receive an invoice from [facility]. Please contact [facility] at [phone] with questions or concerns regarding your invoice.   IF you received labwork today, you will receive an invoice from [facility]. Please contact [facility] at [phone] with questions or concerns regarding your invoice.   Our billing staff will not be able to assist you with questions regarding bills from these companies.  You will be contacted with the lab results as soon as they are available. The fastest way to get your results is to activate your My Chart account. Instructions are located on the last page of this paperwork. If you have not heard from us regarding the results in 2 weeks, please contact this office.    Acute Bronchitis Bronchitis is inflammation of the airways that extend from the windpipe into the lungs (bronchi). The inflammation often causes mucus to develop. This leads to a cough, which is the most common symptom of bronchitis.  In acute bronchitis, the condition usually develops suddenly and goes away over time, usually in a couple weeks. Smoking, allergies, and asthma can make bronchitis worse. Repeated episodes of bronchitis may cause further lung problems.  CAUSES Acute bronchitis is most often caused by the same virus that causes a cold. The virus can spread from person to person (contagious) through coughing, sneezing, and touching contaminated objects. SIGNS AND SYMPTOMS   Cough.   Fever.   Coughing up mucus.   Body aches.   Chest congestion.   Chills.   Shortness of breath.   Sore throat.  DIAGNOSIS  Acute bronchitis is usually diagnosed through a physical exam. Your health care provider will also ask you questions about your medical history. Tests, such as chest X-rays, are sometimes done to rule out other conditions.  TREATMENT  Acute bronchitis usually goes away in a  couple weeks. Oftentimes, no medical treatment is necessary. Medicines are sometimes given for relief of fever or cough. Antibiotic medicines are usually not needed but may be prescribed in certain situations. In some cases, an inhaler may be recommended to help reduce shortness of breath and control the cough. A cool mist vaporizer may also be used to help thin bronchial secretions and make it easier to clear the chest.  HOME CARE INSTRUCTIONS  Get plenty of rest.   Drink enough fluids to keep your urine clear or pale yellow (unless you have a medical condition that requires fluid restriction). Increasing fluids may help thin your respiratory secretions (sputum) and reduce chest congestion, and it will prevent dehydration.   Take medicines only as directed by your health care provider.  If you were prescribed an antibiotic medicine, finish it all even if you start to feel better.  Avoid smoking and secondhand smoke. Exposure to cigarette smoke or irritating chemicals will make bronchitis worse. If you are a smoker, consider using nicotine gum or skin patches to help control withdrawal symptoms. Quitting smoking will help your lungs heal faster.   Reduce the chances of another bout of acute bronchitis by washing your hands frequently, avoiding people with cold symptoms, and trying not to touch your hands to your mouth, nose, or eyes.   Keep all follow-up visits as directed by your health care provider.  SEEK MEDICAL CARE IF: Your symptoms do not improve after 1 week of treatment.  SEEK IMMEDIATE MEDICAL CARE IF:  You develop an increased fever or chills.   You have chest pain.     You have severe shortness of breath.  You have bloody sputum.   You develop dehydration.  You faint or repeatedly feel like you are going to pass out.  You develop repeated vomiting.  You develop a severe headache. MAKE SURE YOU:   Understand these instructions.  Will watch your  condition.  Will get help right away if you are not doing well or get worse.   This information is not intended to replace advice given to you by your health care provider. Make sure you discuss any questions you have with your health care provider.   Document Released: 12/07/2004 Document Revised: 11/20/2014 Document Reviewed: 04/22/2013 Elsevier Interactive Patient Education 2016 [employer] Inc.  

## 2016-01-22 NOTE — Progress Notes (Signed)
Subjective:  By signing my name below, I, [name], attest that this documentation has been prepared under the direction and in the presence of Delman Cheadle, MD.  Electronically Signed: Thea Alken, ED Scribe. 01/22/2016. 4:04 PM.   Patient ID: Natalie Lara, female    DOB: 1956-11-07, 60 y.o.   MRN: [id number]  HPI Chief Complaint  Patient presents with  . Cough    x 2 day  . Generalized Body Aches    x 1 day  . Chills    x 2 days  . Sore Throat    HPI Comments: Natalie Lara is a 60 y.o. female who presents to the [facility] complaining of cough 2-3 days ago. She reports associated fatigue, chills, body aches and sore throat. Pt states symptoms worsened last night and this morning. She has been taking tylenol, chloraseptic pills and left over Vicodin that expire next month. She has never needed to use an inhaler. She denies fever and rhinorrhea. She smokes about a half a pack a day. Pt received flu shot this year.     Patient Active Problem List   Diagnosis Date Noted  . Chronic cholecystitis 11/25/2013   Past Medical History  Diagnosis Date  . ADD (attention deficit disorder)    Past Surgical History  Procedure Laterality Date  . Tubal ligation    . Cholecystectomy N/A 11/25/2013    Procedure: LAPAROSCOPIC CHOLECYSTECTOMY WITH INTRAOPERATIVE CHOLANGIOGRAM;  Surgeon: Odis Hollingshead, MD;  Location: [facility];  Service: General;  Laterality: N/A;   Allergies  Allergen Reactions  . Other Other (See Comments)    Patient stated that she can not take steroids   Prior to Admission medications   Medication Sig Start Date End Date Taking? Authorizing Provider  acyclovir (ZOVIRAX) 400 MG tablet Take 400 mg by mouth 2 (two) times daily.   Yes Historical Provider, MD  ADDERALL XR 20 MG 24 hr capsule Take 20 mg by mouth every morning. 02/05/15  Yes Historical Provider, MD  calcium citrate-vitamin D (CITRACAL+D) 315-200 MG-UNIT per tablet Take 1 tablet by  mouth daily.   Yes Historical Provider, MD  cyclobenzaprine (FLEXERIL) 10 MG tablet Take 10 mg by mouth 3 (three) times daily as needed for muscle spasms.   Yes Historical Provider, MD  HYDROcodone-acetaminophen (NORCO/VICODIN) 5-325 MG per tablet Take 1 tablet by mouth 2 (two) times daily as needed for moderate pain or severe pain.  03/02/15  Yes Historical Provider, MD  LYSINE PO Take 1 capsule by mouth daily.   Yes Historical Provider, MD  Multiple Vitamins-Minerals (WOMENS MULTIVITAMIN PLUS PO) Take 1 tablet by mouth daily.   Yes Historical Provider, MD  amoxicillin-clavulanate (AUGMENTIN) 875-125 MG per tablet Take 1 tablet by mouth every 12 (twelve) hours. Reported on 01/22/2016    Historical Provider, MD  naproxen (NAPROSYN) 500 MG tablet Take 1 tablet by mouth 2 (two) times daily. Reported on 01/22/2016 03/02/15   Historical Provider, MD   Social History   Social History  . Marital Status: Divorced    Spouse Name: N/A  . Number of Children: N/A  . Years of Education: N/A   Occupational History  . Not on file.   Social History Main Topics  . Smoking status: Current Every Day Smoker -- 1.00 packs/day    Types: Cigarettes  . Smokeless tobacco: Never Used  . Alcohol Use: Yes     Comment: rare  . Drug Use: No  . Sexual Activity: No  Other Topics Concern  . Not on file   Social History Narrative   Review of Systems  Constitutional: Positive for chills and fatigue. Negative for fever.  HENT: Positive for congestion, sore throat and voice change. Negative for rhinorrhea.   Respiratory: Positive for cough. Negative for chest tightness, shortness of breath and wheezing.   Gastrointestinal: Negative for vomiting and abdominal pain.  Musculoskeletal: Positive for myalgias and arthralgias.  Allergic/Immunologic: Negative for immunocompromised state.  Hematological: Negative for adenopathy.  Psychiatric/Behavioral: Positive for sleep disturbance.   Objective:   Physical Exam    Constitutional: She is oriented to person, place, and time. She appears well-developed and well-nourished. No distress.  HENT:  Head: Normocephalic and atraumatic.  Right Ear: Tympanic membrane is erythematous.  Left Ear: Tympanic membrane is erythematous.  Nose: Nose normal.  Mouth/Throat: Posterior oropharyngeal erythema ( bright red) present.  Eyes: Conjunctivae and EOM are normal.  Neck: Neck supple.  Cardiovascular: Normal rate.   Pulmonary/Chest: Effort normal. She has decreased breath sounds. She has rales ( occasioanal).  Musculoskeletal: Normal range of motion.  Lymphadenopathy:       Head (right side): Tonsillar adenopathy present.       Head (left side): Tonsillar adenopathy present.    She has no cervical adenopathy.       Right cervical: No posterior cervical adenopathy present.      Left cervical: No posterior cervical adenopathy present.  Neurological: She is alert and oriented to person, place, and time.  Skin: Skin is warm and dry.  Psychiatric: She has a normal mood and affect. Her behavior is normal.  Nursing note and vitals reviewed.  Filed Vitals:   01/22/16 1538  BP: 102/64  Pulse: 85  Temp: 98.2 F (36.8 C)  TempSrc: Oral  Resp: 16  Height: 5\' 2"  (1.575 m)  Weight: 148 lb (67.132 kg)  SpO2: 97%   Results for orders placed or performed in visit on 01/22/16  POCT Influenza A/B  Result Value Ref Range   Influenza A, POC Negative Negative   Influenza B, POC Negative Negative  POCT rapid strep A  Result Value Ref Range   Rapid Strep A Screen Negative Negative  POCT CBC  Result Value Ref Range   WBC 5.4 4.6 - 10.2 K/uL   Lymph, poc 1.7 0.6 - 3.4   POC LYMPH PERCENT 31 10 - 50 %L   MID (cbc) 9 (A) 0 - 0.9   POC MID % 0.6 0 - 12 %M   POC Granulocyte 10.7 (A) 2 - 6.9   Granulocyte percent 3.1 (A) 37 - 80 %G   RBC 57.4 (A) 4.04 - 5.48 M/uL   Hemoglobin 4.05 (A) 12.2 - 16.2 g/dL   HCT, POC 13.8 (A) 37.7 - 47.9 %   MCV 38.6 (A) 80 - 97 fL   MCH,  POC 95.4 (A) 27 - 31.2 pg   MCHC 34.1 31.8 - 35.4 g/dL   RDW, POC 35.7 %   Platelet Count, POC 13.2 (A) 142 - 424 K/uL   MPV 6.8 0 - 99.8 fL    Assessment & Plan:   1. Acute URI   2. Tobacco use disorder   3. Acute bronchitis, unspecified organism     Orders Placed This Encounter  Procedures  . Culture, Group A Strep    Order Specific Question:  Source    Answer:  throat  . POCT Influenza A/B  . POCT rapid strep A  . POCT CBC  Meds ordered this encounter  Medications  . cyclobenzaprine (FLEXERIL) 10 MG tablet    Sig: Take 10 mg by mouth 3 (three) times daily as needed for muscle spasms.  Marland Kitchen albuterol (PROVENTIL) (2.5 MG/3ML) 0.083% nebulizer solution 2.5 mg    Sig:   . ipratropium (ATROVENT) nebulizer solution 0.5 mg    Sig:   . Guaifenesin (MUCINEX MAXIMUM STRENGTH) 1200 MG TB12    Sig: Take 1 tablet (1,200 mg total) by mouth every 12 (twelve) hours as needed.    Dispense:  14 tablet    Refill:  1  . HYDROcodone-homatropine (HYCODAN) 5-1.5 MG/5ML syrup    Sig: Take 5 mLs by mouth every 8 (eight) hours as needed for cough.    Dispense:  120 mL    Refill:  0  . albuterol (PROVENTIL HFA;VENTOLIN HFA) 108 (90 Base) MCG/ACT inhaler    Sig: Inhale 2 puffs into the lungs every 4 (four) hours as needed for wheezing or shortness of breath.    Dispense:  1 Inhaler    Refill:  0    I personally performed the services described in this documentation, which was scribed in my presence. The recorded information has been reviewed and considered, and addended by me as needed.  Delman Cheadle, MD MPH

## 2016-01-23 LAB — CULTURE, GROUP A STREP: ORGANISM ID, BACTERIA: NORMAL

## 2016-07-20 ENCOUNTER — Other Ambulatory Visit: Payer: Self-pay | Admitting: Otolaryngology

## 2016-07-20 DIAGNOSIS — E042 Nontoxic multinodular goiter: Secondary | ICD-10-CM

## 2016-08-08 ENCOUNTER — Ambulatory Visit
Admission: RE | Admit: 2016-08-08 | Discharge: 2016-08-08 | Disposition: A | Discharge status: Home / Self Care | Payer: 59 | Source: Ambulatory Visit | Attending: Otolaryngology | Admitting: Otolaryngology

## 2016-08-08 DIAGNOSIS — E042 Nontoxic multinodular goiter: Secondary | ICD-10-CM

## 2017-03-05 DIAGNOSIS — E7800 Pure hypercholesterolemia, unspecified: Secondary | ICD-10-CM | POA: Diagnosis not present

## 2017-03-05 DIAGNOSIS — Z0000 Encounter for general adult medical examination without abnormal findings: Secondary | ICD-10-CM | POA: Diagnosis not present

## 2017-03-05 DIAGNOSIS — Z23 Encounter for immunization: Secondary | ICD-10-CM | POA: Diagnosis not present

## 2017-03-12 DIAGNOSIS — H25813 Combined forms of age-related cataract, bilateral: Secondary | ICD-10-CM | POA: Diagnosis not present

## 2017-03-12 DIAGNOSIS — H1789 Other corneal scars and opacities: Secondary | ICD-10-CM | POA: Diagnosis not present

## 2017-03-28 DIAGNOSIS — L089 Local infection of the skin and subcutaneous tissue, unspecified: Secondary | ICD-10-CM | POA: Diagnosis not present

## 2017-03-28 DIAGNOSIS — B9689 Other specified bacterial agents as the cause of diseases classified elsewhere: Secondary | ICD-10-CM | POA: Diagnosis not present

## 2017-07-05 ENCOUNTER — Encounter (HOSPITAL_COMMUNITY): Payer: Self-pay | Admitting: Emergency Medicine

## 2017-07-05 ENCOUNTER — Emergency Department (HOSPITAL_COMMUNITY)
Admission: EM | Admit: 2017-07-05 | Discharge: 2017-07-06 | Disposition: A | Discharge status: Home / Self Care | Payer: 59 | Attending: Physician Assistant | Admitting: Physician Assistant

## 2017-07-05 DIAGNOSIS — F17210 Nicotine dependence, cigarettes, uncomplicated: Secondary | ICD-10-CM | POA: Insufficient documentation

## 2017-07-05 DIAGNOSIS — Z79899 Other long term (current) drug therapy: Secondary | ICD-10-CM | POA: Diagnosis not present

## 2017-07-05 DIAGNOSIS — M545 Low back pain, unspecified: Secondary | ICD-10-CM

## 2017-07-05 MED ORDER — IBUPROFEN 600 MG PO TABS: 600.0000 mg | ORAL_TABLET | Freq: Four times a day (QID) | ORAL | 0 refills | Status: AC | PRN

## 2017-07-05 MED ORDER — TRAMADOL HCL 50 MG PO TABS: 50.0000 mg | ORAL_TABLET | Freq: Four times a day (QID) | ORAL | 0 refills | Status: AC | PRN

## 2017-07-05 NOTE — ED Triage Notes (Signed)
[facility] arrives from [facility]. Was seen there for right and left lower back pain. Xrays did not show any acute fracture, but MD there was concerned for fecal impaction. Sent patient here for that. Patient states she is very regular and that she is confused as to why he would send her emergently. Endorses large BM today. Back pain was brought on by accidentally falling asleep on hard floor. Denies other complaints. Back pain is severe. Patient ambulatory, but exacerbates pain.

## 2017-07-05 NOTE — ED Provider Notes (Signed)
[facility] DEPT Provider Note   CSN: [id number] Arrival date & time: 07/05/17  2101     History   Chief Complaint Chief Complaint  Patient presents with  . Back Pain    HPI Natalie Lara is a 61 y.o. female.  The history is provided by the patient and medical records. No language interpreter was used.  Back Pain   Pertinent negatives include no numbness, no abdominal pain, no dysuria and no weakness.   Natalie Lara is a 61 y.o. female  with a PMH of ADD who presents to the Emergency Department from orthopedic urgent care for further evaluation regarding x-ray findings. Patient presented to urgent care today because of low back pain. She states that she fell asleep last night for about two hours on a hard wooden floor and when she awoke, her back was stiff. This morning, her low back pain was much worse. She took tylenol and applied warm heat compress with little improvement. Pain worse with movement and ambulation. She went to the ortho urgent care where and x-ray of her back was performed. From the ortho stand point, everything looked fine with her back, however an ileus was seen and she was told to come to ER for further evaluation. She is having no abdominal pain, nausea, vomiting, diarrhea, constipation or blood in the stool. She had a large BM yesterday and a normal BM this morning. She has passed gas since x-ray was performed. Patient denies upper back or neck pain. No fever, saddle anesthesia, weakness, numbness, urinary complaints including retention/incontinence. No history of cancer, IVDU, or recent spinal procedures.  Past Medical History:  Diagnosis Date  . ADD (attention deficit disorder)     Patient Active Problem List   Diagnosis Date Noted  . Chronic cholecystitis 11/25/2013    Past Surgical History:  Procedure Laterality Date  . CHOLECYSTECTOMY N/A 11/25/2013   Procedure: LAPAROSCOPIC CHOLECYSTECTOMY WITH INTRAOPERATIVE CHOLANGIOGRAM;  Surgeon: Odis Hollingshead, MD;  Location: [facility];  Service: General;  Laterality: N/A;  . TUBAL LIGATION      OB History    No data available       Home Medications    Prior to Admission medications   Medication Sig Start Date End Date Taking? Authorizing Provider  acyclovir (ZOVIRAX) 400 MG tablet Take 400 mg by mouth 2 (two) times daily.    [provider]  ADDERALL XR 20 MG 24 hr capsule Take 20 mg by mouth every morning. 02/05/15   [provider]  albuterol (PROVENTIL HFA;VENTOLIN HFA) 108 (90 Base) MCG/ACT inhaler Inhale 2 puffs into the lungs every 4 (four) hours as needed for wheezing or shortness of breath. 01/22/16   Shawnee Knapp, MD  calcium citrate-vitamin D (CITRACAL+D) 315-200 MG-UNIT per tablet Take 1 tablet by mouth daily.    [provider]  cyclobenzaprine (FLEXERIL) 10 MG tablet Take 10 mg by mouth 3 (three) times daily as needed for muscle spasms.    [provider]  Guaifenesin (MUCINEX MAXIMUM STRENGTH) 1200 MG TB12 Take 1 tablet (1,200 mg total) by mouth every 12 (twelve) hours as needed. 01/22/16   Shawnee Knapp, MD  HYDROcodone-acetaminophen (NORCO/VICODIN) 5-325 MG per tablet Take 1 tablet by mouth 2 (two) times daily as needed for moderate pain or severe pain.  03/02/15   [provider]  HYDROcodone-homatropine (HYCODAN) 5-1.5 MG/5ML syrup Take 5 mLs by mouth every 8 (eight) hours as needed for cough. 01/22/16   Shawnee Knapp,  MD  ibuprofen (ADVIL,MOTRIN) 600 MG tablet Take 1 tablet (600 mg total) by mouth every 6 (six) hours as needed. 07/05/17   Abiageal Blowe, Ozella Almond, PA-C  LYSINE PO Take 1 capsule by mouth daily.    [provider]  Multiple Vitamins-Minerals (WOMENS MULTIVITAMIN PLUS PO) Take 1 tablet by mouth daily.    [provider]  naproxen (NAPROSYN) 500 MG tablet Take 1 tablet by mouth 2 (two) times daily. Reported on 01/22/2016 03/02/15   [provider]  traMADol (ULTRAM) 50 MG tablet Take 1 tablet (50 mg  total) by mouth every 6 (six) hours as needed for severe pain. 07/05/17   Gyanna Jarema, Ozella Almond, PA-C    Family History History reviewed. No pertinent family history.  Social History Social History  Substance Use Topics  . Smoking status: Current Every Day Smoker    Packs/day: 1.00    Types: Cigarettes  . Smokeless tobacco: Never Used  . Alcohol use Yes     Comment: rare     Allergies   Other   Review of Systems Review of Systems  Gastrointestinal: Negative for abdominal pain, blood in stool, constipation, diarrhea, nausea, rectal pain and vomiting.  Genitourinary: Negative for difficulty urinating and dysuria.  Musculoskeletal: Positive for back pain. Negative for neck pain.  Neurological: Negative for weakness and numbness.     Physical Exam Updated Vital Signs BP (!) 148/82 (BP Location: Right Arm)   Pulse 79   Temp 98.2 F (36.8 C) (Oral)   Resp 16   LMP 01/25/2015   SpO2 99%   Physical Exam  Constitutional: She is oriented to person, place, and time. She appears well-developed and well-nourished. No distress.  HENT:  Head: Normocephalic and atraumatic.  Cardiovascular: Normal rate, regular rhythm and normal heart sounds.   No murmur heard. Pulmonary/Chest: Effort normal and breath sounds normal. No respiratory distress.  Abdominal: Soft. She exhibits no distension.  No abdominal tenderness.   Musculoskeletal:       Arms: Tenderness to palpation as depicted in image. Straight leg raises negative bilaterally. 5/5 muscle strength to bilateral LE's.   Neurological: She is alert and oriented to person, place, and time.  Bilateral lower extremities neurovascularly intact.  Skin: Skin is warm and dry.  Nursing note and vitals reviewed.    ED Treatments / Results  Labs (all labs ordered are listed, but only abnormal results are displayed) Labs Reviewed - No data to display  EKG  EKG Interpretation None       Radiology No results  found.  Procedures Procedures (including critical care time)  Medications Ordered in ED Medications - No data to display   Initial Impression / Assessment and Plan / ED Course  I have reviewed the triage vital signs and the nursing notes.  Pertinent labs & imaging results that were available during my care of the patient were reviewed by me and considered in my medical decision making (see chart for details).    Natalie Lara is a 61 y.o. female who presents to ED from orthopedic urgent care for further evaluation. Patient was seen by ortho urgent care for low back pain after sleeping on hard wood floor for several hours. X-ray obtained which showed no abnormalities regarding low back, however an ileus was noted, therefore sent to ED for further evaluation. On exam, patient is afebrile, hemodynamically stable with no abdominal tenderness. No red flag symptoms of back pain. She has experienced no abdominal pain, nausea, vomiting, diarrhea, constipation or  blood in the stool. She had normal BM's yesterday and this morning. She has passed gas since x-ray. Evaluation does not show pathology that would require ongoing emergent intervention or inpatient treatment. I spoke with patient at length about reasons to return to ER as well as symptomatic mgt of back pain. PCP follow up if back pain persists.  All questions answered.   Patient discussed with Dr. Thomasene Lot who agrees with treatment plan.    Final Clinical Impressions(s) / ED Diagnoses   Final diagnoses:  Acute bilateral low back pain without sciatica    New Prescriptions New Prescriptions   IBUPROFEN (ADVIL,MOTRIN) 600 MG TABLET    Take 1 tablet (600 mg total) by mouth every 6 (six) hours as needed.   TRAMADOL (ULTRAM) 50 MG TABLET    Take 1 tablet (50 mg total) by mouth every 6 (six) hours as needed for severe pain.       Leelynn Whetsel, Ozella Almond, PA-C 07/06/17 0040    Macarthur Critchley, MD 07/06/17 [phone]

## 2017-07-05 NOTE — ED Notes (Signed)
See EDP assessment 

## 2017-07-05 NOTE — Discharge Instructions (Signed)
It was my pleasure taking care of you today!   Ibuprofen as needed for mild to moderate pain. Take pain medication only as needed for severe pain - This can make you very drowsy - please do not drink alcohol, operate heavy machinery or drive on this medication. In addition to this, use ice and/or heat for additional pain relief.  Your back pain should get better over the next week. Please follow up with your doctor this week for a recheck if still having symptoms.  If you begin suddenly experiencing abdominal pain, start vomiting, cannot have bowel movements you need to return to ER immediately.   COLD THERAPY DIRECTIONS:  Ice or gel packs can be used to reduce both pain and swelling. Ice is the most helpful within the first 24 to 48 hours after an injury or flareup from overusing a muscle or joint.  Ice is effective, has very few side effects, and is safe for most people to use.    Return to the ED for worsening back pain, fever, weakness or numbness of either leg, or if you develop either (1) an inability to urinate or have bowel movements, or (2) loss of your ability to control your bathroom functions (if you start having "accidents"), or if you develop other new symptoms that concern you.

## 2017-07-21 ENCOUNTER — Emergency Department (HOSPITAL_COMMUNITY): Payer: 59

## 2017-07-21 ENCOUNTER — Encounter (HOSPITAL_COMMUNITY): Payer: Self-pay | Admitting: Emergency Medicine

## 2017-07-21 ENCOUNTER — Emergency Department (HOSPITAL_COMMUNITY)
Admission: EM | Admit: 2017-07-21 | Discharge: 2017-07-21 | Disposition: A | Discharge status: Home / Self Care | Payer: 59 | Attending: Emergency Medicine | Admitting: Emergency Medicine

## 2017-07-21 DIAGNOSIS — Y92410 Unspecified street and highway as the place of occurrence of the external cause: Secondary | ICD-10-CM | POA: Insufficient documentation

## 2017-07-21 DIAGNOSIS — S0990XA Unspecified injury of head, initial encounter: Secondary | ICD-10-CM | POA: Diagnosis not present

## 2017-07-21 DIAGNOSIS — M5489 Other dorsalgia: Secondary | ICD-10-CM | POA: Diagnosis not present

## 2017-07-21 DIAGNOSIS — S161XXA Strain of muscle, fascia and tendon at neck level, initial encounter: Secondary | ICD-10-CM | POA: Insufficient documentation

## 2017-07-21 DIAGNOSIS — Y999 Unspecified external cause status: Secondary | ICD-10-CM | POA: Insufficient documentation

## 2017-07-21 DIAGNOSIS — M542 Cervicalgia: Secondary | ICD-10-CM | POA: Diagnosis not present

## 2017-07-21 DIAGNOSIS — Y9389 Activity, other specified: Secondary | ICD-10-CM | POA: Insufficient documentation

## 2017-07-21 DIAGNOSIS — Z79899 Other long term (current) drug therapy: Secondary | ICD-10-CM | POA: Insufficient documentation

## 2017-07-21 DIAGNOSIS — S39012A Strain of muscle, fascia and tendon of lower back, initial encounter: Secondary | ICD-10-CM

## 2017-07-21 DIAGNOSIS — S3993XA Unspecified injury of pelvis, initial encounter: Secondary | ICD-10-CM | POA: Diagnosis not present

## 2017-07-21 DIAGNOSIS — S3992XA Unspecified injury of lower back, initial encounter: Secondary | ICD-10-CM | POA: Diagnosis not present

## 2017-07-21 DIAGNOSIS — T148XXA Other injury of unspecified body region, initial encounter: Secondary | ICD-10-CM | POA: Diagnosis not present

## 2017-07-21 DIAGNOSIS — F17210 Nicotine dependence, cigarettes, uncomplicated: Secondary | ICD-10-CM | POA: Diagnosis not present

## 2017-07-21 DIAGNOSIS — S199XXA Unspecified injury of neck, initial encounter: Secondary | ICD-10-CM | POA: Diagnosis not present

## 2017-07-21 DIAGNOSIS — M549 Dorsalgia, unspecified: Secondary | ICD-10-CM | POA: Diagnosis not present

## 2017-07-21 MED ORDER — IBUPROFEN 200 MG PO TABS
600.0000 mg | ORAL_TABLET | Freq: Once | ORAL | Status: AC
  Administered 2017-07-21: 600 mg via ORAL
  Filled 2017-07-21: qty 3

## 2017-07-21 MED ORDER — ACETAMINOPHEN 325 MG PO TABS
650.0000 mg | ORAL_TABLET | Freq: Once | ORAL | Status: AC
  Administered 2017-07-21: 650 mg via ORAL
  Filled 2017-07-21: qty 2

## 2017-07-21 NOTE — ED Notes (Signed)
Patient transported to CT 
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
## 2017-07-21 NOTE — Discharge Instructions (Signed)
Continue taking tylenol and motrin for pain.   Take your flexeril (cyclobenzaprine) or tramadol as needed for spasms. Expect to be stiff and sore for several days.     See your doctor.  Return to ER if you have worse back pain, neck pain, numbness, weakness, headaches, vomiting.

## 2017-07-21 NOTE — ED Notes (Signed)
Pt standing at door way expressing readiness to go home. Called MD Darl Householder to get update on plan of care. MD Darl Householder is currently at the bedside talking to pt.
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
## 2017-07-23 DIAGNOSIS — S134XXA Sprain of ligaments of cervical spine, initial encounter: Secondary | ICD-10-CM | POA: Diagnosis not present

## 2017-07-30 DIAGNOSIS — S134XXD Sprain of ligaments of cervical spine, subsequent encounter: Secondary | ICD-10-CM | POA: Diagnosis not present

## 2017-08-01 DIAGNOSIS — S134XXD Sprain of ligaments of cervical spine, subsequent encounter: Secondary | ICD-10-CM | POA: Diagnosis not present

## 2017-08-01 DIAGNOSIS — M542 Cervicalgia: Secondary | ICD-10-CM | POA: Diagnosis not present

## 2017-08-03 DIAGNOSIS — M542 Cervicalgia: Secondary | ICD-10-CM | POA: Diagnosis not present

## 2017-08-03 DIAGNOSIS — S134XXD Sprain of ligaments of cervical spine, subsequent encounter: Secondary | ICD-10-CM | POA: Diagnosis not present

## 2017-08-06 DIAGNOSIS — S134XXD Sprain of ligaments of cervical spine, subsequent encounter: Secondary | ICD-10-CM | POA: Diagnosis not present

## 2017-08-06 DIAGNOSIS — M542 Cervicalgia: Secondary | ICD-10-CM | POA: Diagnosis not present

## 2017-08-08 DIAGNOSIS — M542 Cervicalgia: Secondary | ICD-10-CM | POA: Diagnosis not present

## 2017-08-08 DIAGNOSIS — S134XXD Sprain of ligaments of cervical spine, subsequent encounter: Secondary | ICD-10-CM | POA: Diagnosis not present

## 2017-08-09 DIAGNOSIS — R921 Mammographic calcification found on diagnostic imaging of breast: Secondary | ICD-10-CM | POA: Diagnosis not present

## 2017-08-14 DIAGNOSIS — S134XXD Sprain of ligaments of cervical spine, subsequent encounter: Secondary | ICD-10-CM | POA: Diagnosis not present

## 2017-08-14 DIAGNOSIS — M542 Cervicalgia: Secondary | ICD-10-CM | POA: Diagnosis not present

## 2017-08-16 DIAGNOSIS — M542 Cervicalgia: Secondary | ICD-10-CM | POA: Diagnosis not present

## 2017-08-16 DIAGNOSIS — S134XXD Sprain of ligaments of cervical spine, subsequent encounter: Secondary | ICD-10-CM | POA: Diagnosis not present

## 2017-08-17 DIAGNOSIS — M542 Cervicalgia: Secondary | ICD-10-CM | POA: Diagnosis not present

## 2017-08-21 DIAGNOSIS — M542 Cervicalgia: Secondary | ICD-10-CM | POA: Diagnosis not present

## 2017-08-21 DIAGNOSIS — S134XXD Sprain of ligaments of cervical spine, subsequent encounter: Secondary | ICD-10-CM | POA: Diagnosis not present

## 2017-08-23 DIAGNOSIS — S134XXD Sprain of ligaments of cervical spine, subsequent encounter: Secondary | ICD-10-CM | POA: Diagnosis not present

## 2017-08-23 DIAGNOSIS — M542 Cervicalgia: Secondary | ICD-10-CM | POA: Diagnosis not present

## 2017-08-27 DIAGNOSIS — S134XXD Sprain of ligaments of cervical spine, subsequent encounter: Secondary | ICD-10-CM | POA: Diagnosis not present

## 2017-08-27 DIAGNOSIS — M542 Cervicalgia: Secondary | ICD-10-CM | POA: Diagnosis not present

## 2017-08-30 DIAGNOSIS — S134XXD Sprain of ligaments of cervical spine, subsequent encounter: Secondary | ICD-10-CM | POA: Diagnosis not present

## 2017-08-30 DIAGNOSIS — M542 Cervicalgia: Secondary | ICD-10-CM | POA: Diagnosis not present

## 2017-09-05 DIAGNOSIS — M542 Cervicalgia: Secondary | ICD-10-CM | POA: Diagnosis not present

## 2017-09-05 DIAGNOSIS — S134XXD Sprain of ligaments of cervical spine, subsequent encounter: Secondary | ICD-10-CM | POA: Diagnosis not present

## 2017-09-11 DIAGNOSIS — S134XXD Sprain of ligaments of cervical spine, subsequent encounter: Secondary | ICD-10-CM | POA: Diagnosis not present

## 2017-09-11 DIAGNOSIS — M542 Cervicalgia: Secondary | ICD-10-CM | POA: Diagnosis not present

## 2017-09-13 DIAGNOSIS — S134XXD Sprain of ligaments of cervical spine, subsequent encounter: Secondary | ICD-10-CM | POA: Diagnosis not present

## 2017-09-13 DIAGNOSIS — M542 Cervicalgia: Secondary | ICD-10-CM | POA: Diagnosis not present

## 2017-09-19 DIAGNOSIS — Z23 Encounter for immunization: Secondary | ICD-10-CM | POA: Diagnosis not present

## 2017-09-19 DIAGNOSIS — G43909 Migraine, unspecified, not intractable, without status migrainosus: Secondary | ICD-10-CM | POA: Diagnosis not present

## 2017-09-24 DIAGNOSIS — M542 Cervicalgia: Secondary | ICD-10-CM | POA: Diagnosis not present

## 2017-09-25 DIAGNOSIS — S134XXD Sprain of ligaments of cervical spine, subsequent encounter: Secondary | ICD-10-CM | POA: Diagnosis not present

## 2017-09-25 DIAGNOSIS — M542 Cervicalgia: Secondary | ICD-10-CM | POA: Diagnosis not present

## 2017-10-01 DIAGNOSIS — M47812 Spondylosis without myelopathy or radiculopathy, cervical region: Secondary | ICD-10-CM | POA: Diagnosis not present

## 2017-10-08 DIAGNOSIS — S134XXD Sprain of ligaments of cervical spine, subsequent encounter: Secondary | ICD-10-CM | POA: Diagnosis not present

## 2017-10-08 DIAGNOSIS — M542 Cervicalgia: Secondary | ICD-10-CM | POA: Diagnosis not present

## 2017-10-10 DIAGNOSIS — M542 Cervicalgia: Secondary | ICD-10-CM | POA: Diagnosis not present

## 2017-10-10 DIAGNOSIS — S134XXD Sprain of ligaments of cervical spine, subsequent encounter: Secondary | ICD-10-CM | POA: Diagnosis not present

## 2017-10-23 DIAGNOSIS — M47812 Spondylosis without myelopathy or radiculopathy, cervical region: Secondary | ICD-10-CM | POA: Diagnosis not present

## 2017-10-24 ENCOUNTER — Other Ambulatory Visit: Payer: Self-pay | Admitting: Otolaryngology

## 2017-10-24 DIAGNOSIS — E041 Nontoxic single thyroid nodule: Secondary | ICD-10-CM

## 2017-10-29 ENCOUNTER — Ambulatory Visit
Admission: RE | Admit: 2017-10-29 | Discharge: 2017-10-29 | Disposition: A | Discharge status: Home / Self Care | Payer: 59 | Source: Ambulatory Visit | Attending: Otolaryngology | Admitting: Otolaryngology

## 2017-10-29 DIAGNOSIS — E041 Nontoxic single thyroid nodule: Secondary | ICD-10-CM

## 2017-11-23 DIAGNOSIS — M47812 Spondylosis without myelopathy or radiculopathy, cervical region: Secondary | ICD-10-CM | POA: Diagnosis not present

## 2018-01-02 DIAGNOSIS — M791 Myalgia, unspecified site: Secondary | ICD-10-CM | POA: Diagnosis not present

## 2018-03-02 DIAGNOSIS — M791 Myalgia, unspecified site: Secondary | ICD-10-CM | POA: Diagnosis not present

## 2018-03-23 DIAGNOSIS — S63634A Sprain of interphalangeal joint of right ring finger, initial encounter: Secondary | ICD-10-CM | POA: Diagnosis not present

## 2018-03-25 DIAGNOSIS — H04123 Dry eye syndrome of bilateral lacrimal glands: Secondary | ICD-10-CM | POA: Diagnosis not present

## 2018-03-25 DIAGNOSIS — H2513 Age-related nuclear cataract, bilateral: Secondary | ICD-10-CM | POA: Diagnosis not present

## 2018-03-25 DIAGNOSIS — H1789 Other corneal scars and opacities: Secondary | ICD-10-CM | POA: Diagnosis not present

## 2018-04-01 DIAGNOSIS — M791 Myalgia, unspecified site: Secondary | ICD-10-CM | POA: Diagnosis not present

## 2018-04-15 DIAGNOSIS — M542 Cervicalgia: Secondary | ICD-10-CM | POA: Diagnosis not present

## 2018-04-23 IMAGING — CT CT L SPINE W/O CM
3 of 4 series · 12 of 33 positions shown, 14 images · non-contrast
Comparison: None.

CLINICAL DATA: Back pain status post minor trauma.

EXAM:
CT LUMBAR SPINE WITHOUT CONTRAST
TECHNIQUE: Multidetector CT imaging of the lumbar spine was performed without
intravenous contrast administration. Multiplanar CT image
reconstructions were also generated.

[Series 2: l-spine · axial · 0.27mm/px · z∈[+560,+726]mm · 4 of 111 slices shown, 5 images]
[im 14/111  soft-tissue]
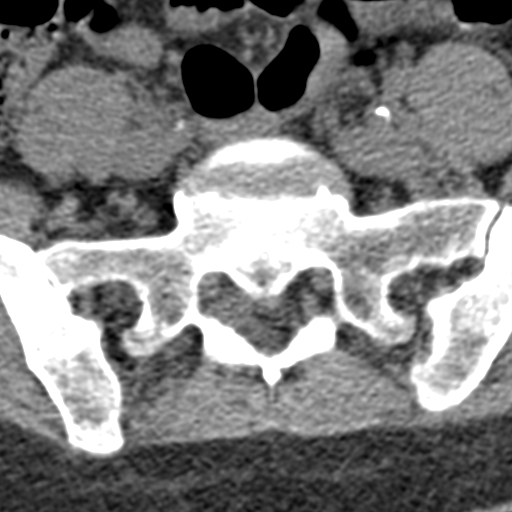
[im 14/111  bone]
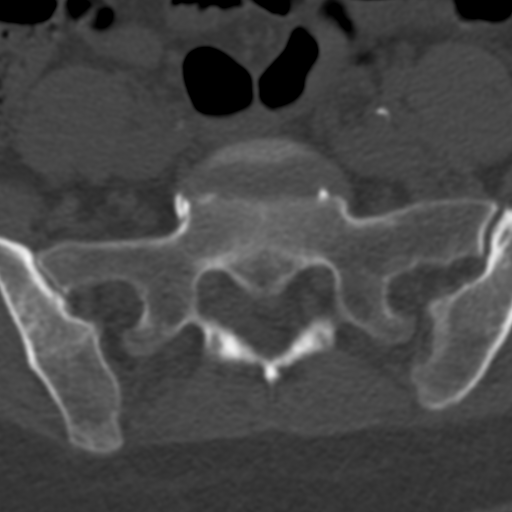
[im 42/111  bone]
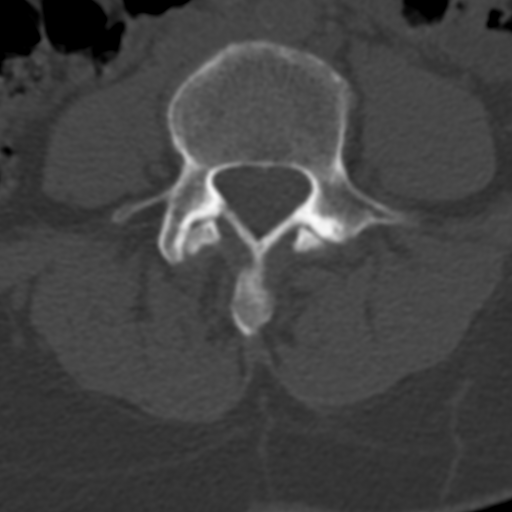
[im 69/111  bone]
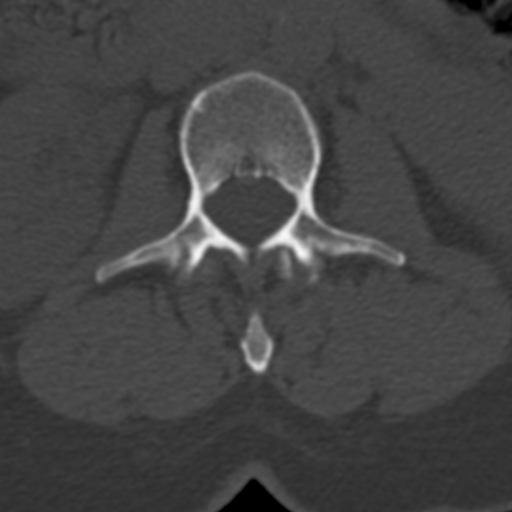
[im 97/111  bone]
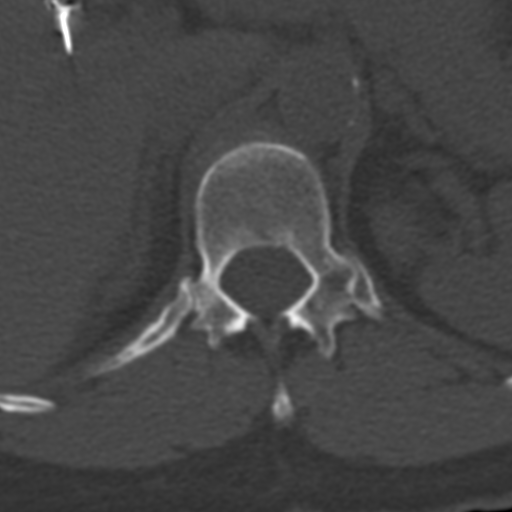

[Series 4: coronal · coronal · 0.26mm/px · 3 of 68 slices shown]
[im 14/68  bone]
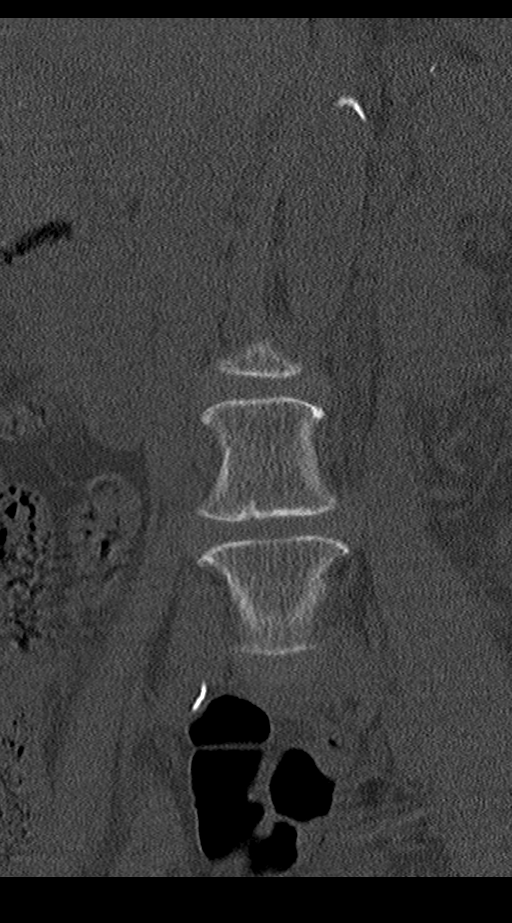
[im 27/68  bone]
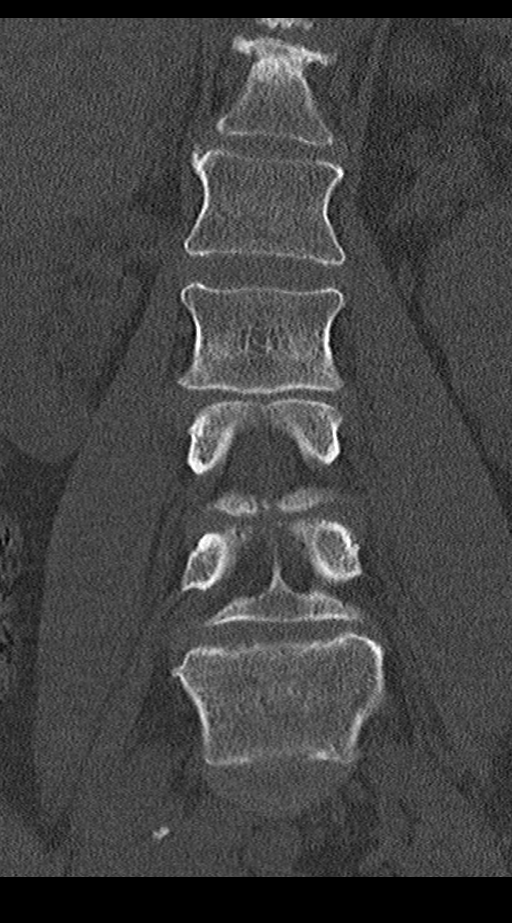
[im 41/68  bone]
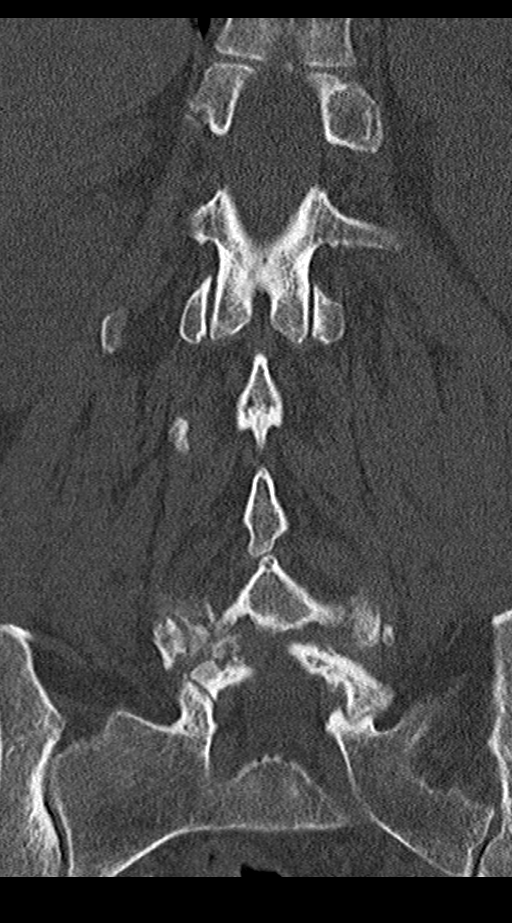

[Series 7: sagittal st · sagittal · 0.32mm/px · 5 of 61 slices shown, 6 images]
[im 21/61  bone]
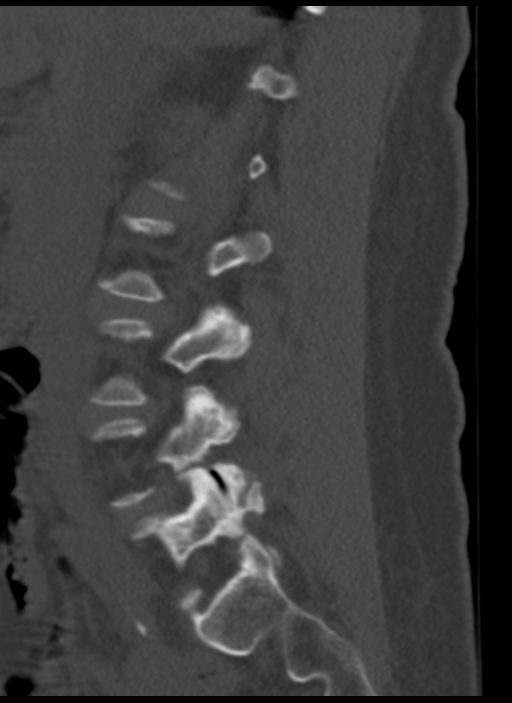
[im 26/61  bone]
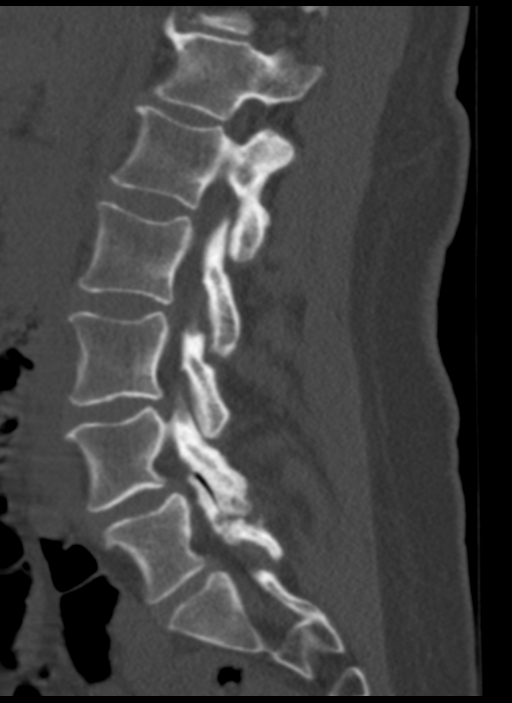
[im 31/61  soft-tissue]
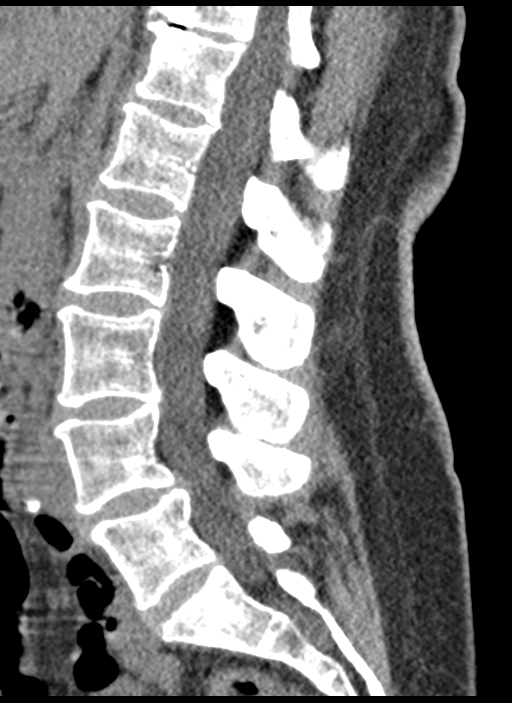
[im 31/61  bone]
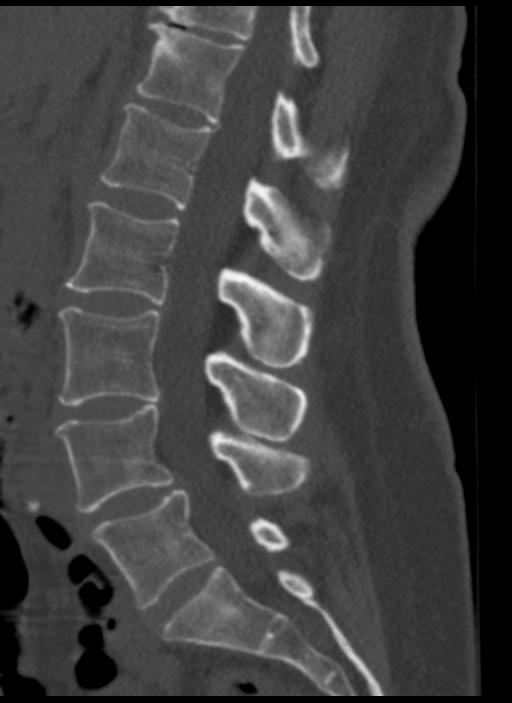
[im 36/61  bone]
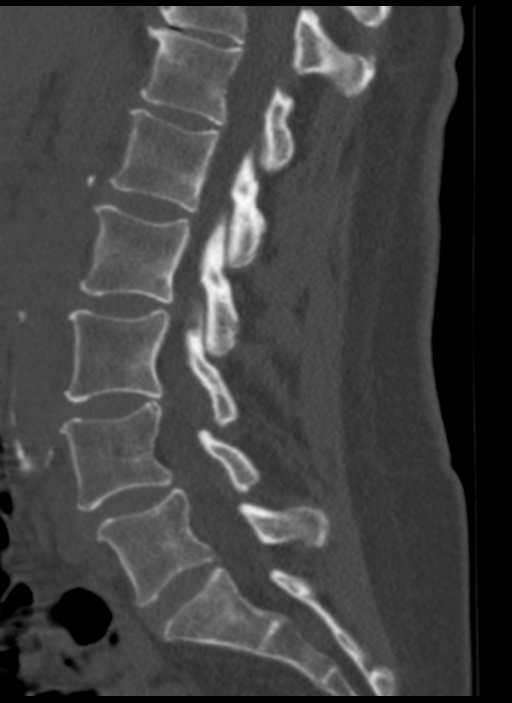
[im 41/61  bone]
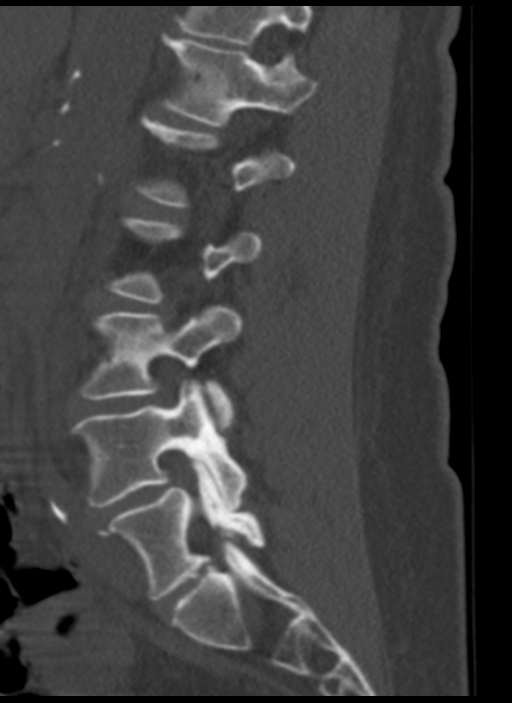

[12 of 33 positions shown; findings below may reference images not displayed]

FINDINGS: Segmentation: 5 lumbar type vertebrae.

Alignment: 2 mm anterolisthesis of L4 on L5.

Vertebrae: No acute fracture or focal pathologic process. Right L5
pars interarticularis defect. Mild osteoarthritis of bilateral
sacroiliac joints.

Paraspinal and other soft tissues: No paraspinal abnormality.
Abdominal aortic atherosclerosis.

Disc levels: Degenerative disc disease with disc height loss at
T11-12. Remainder the disc spaces are maintained. Severe right and
moderate left facet arthropathy at L4-5. Prior right laminotomy
defect at L4-5.

Mild broad-based disc bulge at L3-4 with mild bilateral facet
arthropathy. Mild broad-based disc bulge at L4-5.
IMPRESSION: 1.  No acute osseous injury of the lumbar spine.
2. Lumbar spine spondylosis as described above.
3.  Aortic Atherosclerosis ([id number]-170.0)
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
## 2018-05-02 DIAGNOSIS — M791 Myalgia, unspecified site: Secondary | ICD-10-CM | POA: Diagnosis not present

## 2018-05-13 DIAGNOSIS — Z01419 Encounter for gynecological examination (general) (routine) without abnormal findings: Secondary | ICD-10-CM | POA: Diagnosis not present

## 2018-06-01 DIAGNOSIS — M791 Myalgia, unspecified site: Secondary | ICD-10-CM | POA: Diagnosis not present

## 2018-07-02 DIAGNOSIS — M791 Myalgia, unspecified site: Secondary | ICD-10-CM | POA: Diagnosis not present

## 2018-08-02 DIAGNOSIS — M791 Myalgia, unspecified site: Secondary | ICD-10-CM | POA: Diagnosis not present

## 2018-08-09 DIAGNOSIS — M542 Cervicalgia: Secondary | ICD-10-CM | POA: Diagnosis not present

## 2018-08-12 DIAGNOSIS — Z1231 Encounter for screening mammogram for malignant neoplasm of breast: Secondary | ICD-10-CM | POA: Diagnosis not present

## 2018-08-29 ENCOUNTER — Other Ambulatory Visit: Payer: Self-pay | Admitting: Orthopedic Surgery

## 2018-08-29 DIAGNOSIS — M542 Cervicalgia: Secondary | ICD-10-CM

## 2018-09-01 DIAGNOSIS — M791 Myalgia, unspecified site: Secondary | ICD-10-CM | POA: Diagnosis not present

## 2018-09-14 ENCOUNTER — Ambulatory Visit
Admission: RE | Admit: 2018-09-14 | Discharge: 2018-09-14 | Disposition: A | Discharge status: Home / Self Care | Payer: 59 | Source: Ambulatory Visit | Attending: Orthopedic Surgery | Admitting: Orthopedic Surgery

## 2018-09-14 DIAGNOSIS — M542 Cervicalgia: Secondary | ICD-10-CM

## 2018-09-14 DIAGNOSIS — M50322 Other cervical disc degeneration at C5-C6 level: Secondary | ICD-10-CM | POA: Diagnosis not present

## 2018-09-14 DIAGNOSIS — M50323 Other cervical disc degeneration at C6-C7 level: Secondary | ICD-10-CM | POA: Diagnosis not present

## 2018-09-14 DIAGNOSIS — M47812 Spondylosis without myelopathy or radiculopathy, cervical region: Secondary | ICD-10-CM | POA: Diagnosis not present

## 2018-09-14 DIAGNOSIS — M4802 Spinal stenosis, cervical region: Secondary | ICD-10-CM | POA: Diagnosis not present

## 2018-09-27 DIAGNOSIS — M542 Cervicalgia: Secondary | ICD-10-CM | POA: Diagnosis not present

## 2018-10-02 DIAGNOSIS — M791 Myalgia, unspecified site: Secondary | ICD-10-CM | POA: Diagnosis not present

## 2018-10-04 DIAGNOSIS — F17210 Nicotine dependence, cigarettes, uncomplicated: Secondary | ICD-10-CM | POA: Diagnosis not present

## 2018-10-04 DIAGNOSIS — M47812 Spondylosis without myelopathy or radiculopathy, cervical region: Secondary | ICD-10-CM | POA: Diagnosis not present

## 2018-10-28 DIAGNOSIS — M542 Cervicalgia: Secondary | ICD-10-CM | POA: Diagnosis not present

## 2018-10-28 DIAGNOSIS — Z23 Encounter for immunization: Secondary | ICD-10-CM | POA: Diagnosis not present

## 2018-10-29 DIAGNOSIS — M47812 Spondylosis without myelopathy or radiculopathy, cervical region: Secondary | ICD-10-CM | POA: Diagnosis not present

## 2018-11-01 DIAGNOSIS — M791 Myalgia, unspecified site: Secondary | ICD-10-CM | POA: Diagnosis not present

## 2018-11-27 DIAGNOSIS — M47812 Spondylosis without myelopathy or radiculopathy, cervical region: Secondary | ICD-10-CM | POA: Diagnosis not present

## 2018-12-02 DIAGNOSIS — M791 Myalgia, unspecified site: Secondary | ICD-10-CM | POA: Diagnosis not present

## 2018-12-16 DIAGNOSIS — M47812 Spondylosis without myelopathy or radiculopathy, cervical region: Secondary | ICD-10-CM | POA: Diagnosis not present

## 2018-12-25 DIAGNOSIS — M47812 Spondylosis without myelopathy or radiculopathy, cervical region: Secondary | ICD-10-CM | POA: Diagnosis not present

## 2018-12-27 DIAGNOSIS — R509 Fever, unspecified: Secondary | ICD-10-CM | POA: Diagnosis not present

## 2018-12-27 DIAGNOSIS — J111 Influenza due to unidentified influenza virus with other respiratory manifestations: Secondary | ICD-10-CM | POA: Diagnosis not present

## 2018-12-30 DIAGNOSIS — J209 Acute bronchitis, unspecified: Secondary | ICD-10-CM | POA: Diagnosis not present

## 2018-12-30 DIAGNOSIS — F17210 Nicotine dependence, cigarettes, uncomplicated: Secondary | ICD-10-CM | POA: Diagnosis not present

## 2018-12-30 DIAGNOSIS — J101 Influenza due to other identified influenza virus with other respiratory manifestations: Secondary | ICD-10-CM | POA: Diagnosis not present

## 2019-01-02 DIAGNOSIS — M791 Myalgia, unspecified site: Secondary | ICD-10-CM | POA: Diagnosis not present

## 2019-01-24 DIAGNOSIS — M47816 Spondylosis without myelopathy or radiculopathy, lumbar region: Secondary | ICD-10-CM | POA: Diagnosis not present

## 2019-01-24 DIAGNOSIS — F17210 Nicotine dependence, cigarettes, uncomplicated: Secondary | ICD-10-CM | POA: Diagnosis not present

## 2019-01-31 DIAGNOSIS — M791 Myalgia, unspecified site: Secondary | ICD-10-CM | POA: Diagnosis not present

## 2019-02-04 DIAGNOSIS — E041 Nontoxic single thyroid nodule: Secondary | ICD-10-CM | POA: Diagnosis not present

## 2019-02-06 ENCOUNTER — Other Ambulatory Visit: Payer: Self-pay | Admitting: Otolaryngology

## 2019-02-06 DIAGNOSIS — E041 Nontoxic single thyroid nodule: Secondary | ICD-10-CM

## 2019-03-31 ENCOUNTER — Other Ambulatory Visit: Payer: 59

## 2019-03-31 DIAGNOSIS — H1789 Other corneal scars and opacities: Secondary | ICD-10-CM | POA: Diagnosis not present

## 2019-03-31 DIAGNOSIS — H04123 Dry eye syndrome of bilateral lacrimal glands: Secondary | ICD-10-CM | POA: Diagnosis not present

## 2019-03-31 DIAGNOSIS — H2513 Age-related nuclear cataract, bilateral: Secondary | ICD-10-CM | POA: Diagnosis not present

## 2019-04-15 ENCOUNTER — Ambulatory Visit
Admission: RE | Admit: 2019-04-15 | Discharge: 2019-04-15 | Disposition: A | Discharge status: Home / Self Care | Payer: 59 | Source: Ambulatory Visit | Attending: Otolaryngology | Admitting: Otolaryngology

## 2019-04-15 DIAGNOSIS — E041 Nontoxic single thyroid nodule: Secondary | ICD-10-CM

## 2023-02-01 ENCOUNTER — Other Ambulatory Visit: Payer: Self-pay | Admitting: Family Medicine

## 2023-02-01 DIAGNOSIS — E7800 Pure hypercholesterolemia, unspecified: Secondary | ICD-10-CM

## 2023-03-05 ENCOUNTER — Ambulatory Visit
Admission: RE | Admit: 2023-03-05 | Discharge: 2023-03-05 | Disposition: A | Discharge status: Home / Self Care | Payer: No Typology Code available for payment source | Source: Ambulatory Visit | Attending: Family Medicine | Admitting: Family Medicine

## 2023-03-05 DIAGNOSIS — E7800 Pure hypercholesterolemia, unspecified: Secondary | ICD-10-CM

## 2023-07-27 ENCOUNTER — Encounter: Payer: Self-pay | Admitting: Acute Care

## 2023-08-07 ENCOUNTER — Other Ambulatory Visit: Payer: Self-pay

## 2023-08-07 DIAGNOSIS — Z87891 Personal history of nicotine dependence: Secondary | ICD-10-CM

## 2023-08-07 DIAGNOSIS — F17210 Nicotine dependence, cigarettes, uncomplicated: Secondary | ICD-10-CM

## 2023-08-07 DIAGNOSIS — Z122 Encounter for screening for malignant neoplasm of respiratory organs: Secondary | ICD-10-CM

## 2023-08-28 ENCOUNTER — Ambulatory Visit (INDEPENDENT_AMBULATORY_CARE_PROVIDER_SITE_OTHER): Payer: Self-pay | Admitting: Acute Care

## 2023-08-28 DIAGNOSIS — F17210 Nicotine dependence, cigarettes, uncomplicated: Secondary | ICD-10-CM

## 2023-08-28 NOTE — Progress Notes (Addendum)
 Provider Attestation I agree with the documentation of the Shared Decision Making visit,  smoking cessation counseling if appropriate, and verification or eligibility for lung cancer screening as documented by the RN Nurse Navigator.   Lauraine PHEBE Lites, MSN, AGACNP-BC [facility] See Amion for personal pager PCCM on call pager [phone]     Virtual Visit via Telephone Note  I connected with Barnie ONEIDA Fenton on 08/28/23 at  4:00 PM EDT by telephone and verified that I am speaking with the correct person using two identifiers.  Location: Patient: Natalie Lara Provider: Laneta Speaks, RN   I discussed the limitations, risks, security and privacy concerns of performing an evaluation and management service by telephone and the availability of in person appointments. I also discussed with the patient that there may be a patient responsible charge related to this service. The patient expressed understanding and agreed to proceed.    Shared Decision Making Visit Lung Cancer Screening Program [phone])   Eligibility: Age 2 y.o. Pack Years Smoking History Calculation 28 (# packs/per year x # years smoked) Recent History of coughing up blood  no Unexplained weight loss? no ( >Than 15 pounds within the last 6 months ) Prior History Lung / other cancer no (Diagnosis within the last 5 years already requiring surveillance chest CT Scans). Smoking Status Current Smoker   Visit Components: Discussion included one or more decision making aids. yes Discussion included risk/benefits of screening. yes Discussion included potential follow up diagnostic testing for abnormal scans. yes Discussion included meaning and risk of over diagnosis. yes Discussion included meaning and risk of False Positives. yes Discussion included meaning of total radiation exposure. yes  Counseling Included: Importance of adherence to annual lung cancer LDCT screening.  yes Impact of comorbidities on ability to participate in the program. yes Ability and willingness to under diagnostic treatment. yes  Smoking Cessation Counseling: Current Smokers:  Discussed importance of smoking cessation. yes Information about tobacco cessation classes and interventions provided to patient. yes Diagnosis Code: Tobacco Use Z72.0 Asymptomatic Patient yes  Counseling (Intermediate counseling: > three minutes counseling) H9563 Written Order for Lung Cancer Screening with LDCT placed in Epic. Yes (CT Chest Lung Cancer Screening Low Dose W/O CM) [id number] Z12.2-Screening of respiratory organs Z87.891-Personal history of nicotine dependence   Laneta Speaks, RN

## 2023-08-28 NOTE — Patient Instructions (Signed)

## 2023-08-29 ENCOUNTER — Ambulatory Visit
Admission: RE | Admit: 2023-08-29 | Discharge: 2023-08-29 | Disposition: A | Discharge status: Home / Self Care | Payer: Medicare Other | Source: Ambulatory Visit | Attending: Acute Care | Admitting: Acute Care

## 2023-08-29 DIAGNOSIS — Z122 Encounter for screening for malignant neoplasm of respiratory organs: Secondary | ICD-10-CM

## 2023-08-29 DIAGNOSIS — Z87891 Personal history of nicotine dependence: Secondary | ICD-10-CM

## 2023-08-29 DIAGNOSIS — F17210 Nicotine dependence, cigarettes, uncomplicated: Secondary | ICD-10-CM

## 2023-09-03 ENCOUNTER — Encounter (HOSPITAL_BASED_OUTPATIENT_CLINIC_OR_DEPARTMENT_OTHER): Payer: Self-pay | Admitting: Internal Medicine

## 2023-09-03 ENCOUNTER — Ambulatory Visit (INDEPENDENT_AMBULATORY_CARE_PROVIDER_SITE_OTHER): Payer: Medicare Other | Admitting: Internal Medicine

## 2023-09-03 ENCOUNTER — Telehealth (HOSPITAL_BASED_OUTPATIENT_CLINIC_OR_DEPARTMENT_OTHER): Payer: Self-pay | Admitting: Pharmacy Technician

## 2023-09-03 ENCOUNTER — Other Ambulatory Visit (HOSPITAL_COMMUNITY): Payer: Self-pay

## 2023-09-03 VITALS — BP 110/74 | HR 62 | Ht 62.0 in | Wt 142.0 lb

## 2023-09-03 DIAGNOSIS — E7801 Familial hypercholesterolemia: Secondary | ICD-10-CM

## 2023-09-03 DIAGNOSIS — R931 Abnormal findings on diagnostic imaging of heart and coronary circulation: Secondary | ICD-10-CM

## 2023-09-03 DIAGNOSIS — T466X5A Adverse effect of antihyperlipidemic and antiarteriosclerotic drugs, initial encounter: Secondary | ICD-10-CM | POA: Diagnosis not present

## 2023-09-03 DIAGNOSIS — M791 Myalgia, unspecified site: Secondary | ICD-10-CM

## 2023-09-03 NOTE — Progress Notes (Signed)
LIPID CLINIC CONSULT NOTE  Chief Complaint:  Manage dyslipidemia  Primary Care Physician: Elias Else, MD (Inactive)  Primary Cardiologist:  None  HPI:  Natalie Lara is a 67 y.o. female who is being seen today for the evaluation of dyslipidemia at the request of [name], MD. this is a pleasant 67 year old female [facility] referred for evaluation management of dyslipidemia.  She had a recent coronary calcium score which was significantly elevated at 539, 96 percentile for age and sex matched controls.  This did demonstrate multivessel coronary artery calcification.  She does have a family history of multiple family members mostly women with early onset heart disease but also her son had an MI at age 89.  She has been a smoker but has not been able to quit.  She had previously trialed on statin therapies, including rosuvastatin but said this caused significant hair loss and she was not interested in trying other statins.  Her most recent lipid profile was in September of this year which showed total cholesterol 258, triglycerides 97, HDL 57 and LDL of 183.  These findings are highly suggestive of a genetic or familial hyperlipidemia especially given multiple family members with increased cholesterol and an earlier onset heart disease.  Overall her diet sounds somewhat [place] but she does try to avoid saturated fats and other sources of cholesterol.  PMHx:  Past Medical History:  Diagnosis Date   ADD (attention deficit disorder)     Past Surgical History:  Procedure Laterality Date   CHOLECYSTECTOMY N/A 11/25/2013   Procedure: LAPAROSCOPIC CHOLECYSTECTOMY WITH INTRAOPERATIVE CHOLANGIOGRAM;  Surgeon: Adolph Pollack, MD;  Location: [facility];  Service: General;  Laterality: N/A;   TUBAL LIGATION      FAMHx:  History reviewed. No pertinent family history.  SOCHx:   reports that she quit smoking about 34 years ago. Her smoking use included cigarettes. She started smoking about  46 years ago. She has a 36.8 pack-year smoking history. She has never used smokeless tobacco. She reports current alcohol use. She reports that she does not use drugs.  ALLERGIES:  Allergies  Allergen Reactions   Other Other (See Comments)    Patient stated that she can not take steroids    ROS: Pertinent items noted in HPI and remainder of comprehensive ROS otherwise negative.  HOME MEDS: Current Outpatient Medications on File Prior to Visit  Medication Sig Dispense Refill   acyclovir (ZOVIRAX) 400 MG tablet Take 400 mg by mouth 2 (two) times daily.     ADDERALL XR 20 MG 24 hr capsule Take 20 mg by mouth every morning.  0   calcium citrate-vitamin D (CITRACAL+D) 315-200 MG-UNIT per tablet Take 1 tablet by mouth daily.     cyclobenzaprine (FLEXERIL) 10 MG tablet Take 10 mg by mouth 3 (three) times daily as needed for muscle spasms.     ibuprofen (ADVIL,MOTRIN) 600 MG tablet Take 1 tablet (600 mg total) by mouth every 6 (six) hours as needed. 30 tablet 0   Multiple Vitamins-Minerals (WOMENS MULTIVITAMIN PLUS PO) Take 1 tablet by mouth daily.     albuterol (PROVENTIL HFA;VENTOLIN HFA) 108 (90 Base) MCG/ACT inhaler Inhale 2 puffs into the lungs every 4 (four) hours as needed for wheezing or shortness of breath. 1 Inhaler 0   Guaifenesin (MUCINEX MAXIMUM STRENGTH) 1200 MG TB12 Take 1 tablet (1,200 mg total) by mouth every 12 (twelve) hours as needed. 14 tablet 1   HYDROcodone-acetaminophen (NORCO/VICODIN) 5-325 MG per tablet Take 1 tablet  by mouth 2 (two) times daily as needed for moderate pain or severe pain.      HYDROcodone-homatropine (HYCODAN) 5-1.5 MG/5ML syrup Take 5 mLs by mouth every 8 (eight) hours as needed for cough. 120 mL 0   LYSINE PO Take 1 capsule by mouth daily.     naproxen (NAPROSYN) 500 MG tablet Take 1 tablet by mouth 2 (two) times daily. Reported on 01/22/2016     traMADol (ULTRAM) 50 MG tablet Take 1 tablet (50 mg total) by mouth every 6 (six) hours as needed for severe  pain. 8 tablet 0   No current facility-administered medications on file prior to visit.    LABS/IMAGING: No results found for this or any previous visit (from the past 48 hour(s)). No results found.  LIPID PANEL: No results found for: "CHOL", "TRIG", "HDL", "CHOLHDL", "VLDL", "LDLCALC", "LDLDIRECT"  WEIGHTS: Wt Readings from Last 3 Encounters:  09/03/23 142 lb (64.4 kg)  01/22/16 148 lb (67.1 kg)  12/15/13 148 lb (67.1 kg)    VITALS: BP 110/74 (BP Location: Left Arm, Patient Position: Sitting, Cuff Size: Normal)   Pulse 62   Ht 5\' 2"  (1.575 m)   Wt 142 lb (64.4 kg)   LMP 01/25/2015   SpO2 98%   BMI 25.97 kg/m   EXAM: Deferred  EKG: EKG Interpretation Date/Time:  Monday September 03 2023 14:47:03 EDT Ventricular Rate:  62 PR Interval:  130 QRS Duration:  90 QT Interval:  374 QTC Calculation: 379 R Axis:   67  Text Interpretation: Normal sinus rhythm Normal ECG When compared with ECG of 25-Nov-2013 06:09, No significant change since last tracing Confirmed by Zoila Shutter [id number]) on 09/03/2023 2:55:28 PM    ASSESSMENT: Possible familial hyperlipidemia Multiple family members with high cholesterol and early onset heart disease Statin intolerance-hair loss Significant coronary artery calcification with a CAC score of 539, 96 percentile (2024) Ongoing tobacco use  PLAN: 1.   Ms. Otremba has a possible familial hyperlipidemia with multiple family members that had high cholesterol and early onset heart disease and a son who had an MI at age 51.  She is also a smoker which is a significant cardiovascular risk factor that is ongoing.  She was noted to have multivessel coronary artery calcification with a high score and elevated percentile ranking suggestive of significant early onset heart disease.  Unfortunately she cannot tolerate statin therapy.  Based on this, the next best alternative for greater than 50% lowering of her cholesterol as recommended by guidelines would  be PCSK9 inhibitors.  Will reach out for prior authorization for this and plan repeat lipids including an NMR and LP(a) in about 3 to 4 months on therapy.  Follow-up with me afterwards.  Thanks again for the kind referral.  Chrystie Nose, MD, [facility]  [place]  [facility]  Medical Director of the Advanced Lipid Disorders &  Cardiovascular Risk Reduction Clinic Diplomate of the American Board of Clinical Lipidology Attending Cardiologist  Direct Dial: [phone]  Fax: [phone]  Website:  [email or web address] 09/03/2023, 2:55 PM

## 2023-09-03 NOTE — Telephone Encounter (Signed)
-----   Message from Nurse Eileen Stanford E sent at 09/03/2023  3:24 PM EDT ----- Regarding: PA for Repatha 140 or Praluent 150 Hello team   This patient needs a PA for Repatha 140 or Praluent 150  Dx: Elevated coronary artery calcium score (CAD in native artery) Familial hypercholesterolemia  Has taken crestor w/side effects -- hair loss

## 2023-09-03 NOTE — Patient Instructions (Signed)
Medication Instructions:  Dr. Rennis Golden recommends Repatha Sureclick 140mg /mL OR Praluent 150mg /mL (PCSK9). This is an injectable cholesterol medication self-administered once every 14 days. This medication will likely need prior approval with your insurance company, which we will work on. If the medication is not approved initially, we may need to do an appeal with your insurance.   Administer medication in area of fatty tissue such as abdomen, outer thigh, back of upper arm - and rotate site with each injection Store medication in refrigerator until ready to administer - allow to sit at room temp for 30 mins - 1 hour prior to injection Dispose of medication in a SHARPS container - your pharmacy should be able to direct you on this and proper disposal   If you need a co-pay card for Repatha: Lawsponsor.fr If you need a co-pay card for Praluent: [email or web address]  Patient Assistance:    These foundations have funds at various times.   The [employer] Foundation: https://www.panfoundation.org/disease-funds/hypercholesterolemia/ -- can sign up for wait list  The [facility] offers assistance to help pay for medication copays.  They will cover copays for all cholesterol lowering meds, including statins, fibrates, omega-3 fish oils like Vascepa, ezetimibe, Repatha, Praluent, Nexletol, Nexlizet.  The cards are usually good for $2,500 or 12 months, whichever comes first. Our fax # is [phone] (you will need this to apply) Go to healthwellfoundation.org Click on "Apply Now" Answer questions as to whom is applying (patient or representative) Your disease fund will be "hypercholesterolemia - Medicare access" They will ask questions about finances and which medications you are taking for cholesterol When you submit, the approval is usually within minutes.  You will need to print the card information from the site You will need to show this information  to your pharmacy, they will bill your Medicare Part D plan first -then bill Health Well --for the copay.   You can also call them at [phone], although the hold times can be quite long.     *If you need a refill on your cardiac medications before your next appointment, please call your pharmacy*   Lab Work: FASTING NMR lipoprofile and LPa in about 4 months -- complete ONE WEEK before next appointment  If you have labs (blood work) drawn today and your tests are completely normal, you will receive your results only by: MyChart Message (if you have MyChart) OR A paper copy in the mail If you have any lab test that is abnormal or we need to change your treatment, we will call you to review the results.  Follow-Up: At [facility], you and your health needs are our priority.  As part of our continuing mission to provide you with exceptional heart care, we have created designated Provider Care Teams.  These Care Teams include your primary Cardiologist (physician) and Advanced Practice Providers (APPs -  Physician Assistants and Nurse Practitioners) who all work together to provide you with the care you need, when you need it.  We recommend signing up for the patient portal called "MyChart".  Sign up information is provided on this After Visit Summary.  MyChart is used to connect with patients for Virtual Visits (Telemedicine).  Patients are able to view lab/test results, encounter notes, upcoming appointments, etc.  Non-urgent messages can be sent to your provider as well.   To learn more about what you can do with MyChart, go to [email or web address].    Your next appointment:   4 months with Dr. Rennis Golden -- lipid clinic

## 2023-09-03 NOTE — Telephone Encounter (Signed)
Pharmacy Patient Advocate Encounter   Received notification from  staff messages  that prior authorization for repatha is required/requested.   Insurance verification completed.   The patient is insured through [facility] .   Per test claim: PA required; PA started via CoverMyMeds. KEY [id number] . Waiting for clinical questions to populate.

## 2023-09-04 ENCOUNTER — Other Ambulatory Visit (HOSPITAL_COMMUNITY): Payer: Self-pay

## 2023-09-04 NOTE — Telephone Encounter (Signed)
Pharmacy Patient Advocate Encounter  Received notification from [facility] that Prior Authorization for repatha has been APPROVED from 09/04/23 to 03/04/24. Ran test claim, Copay is $35.00- one month. This test claim was processed through [facility]- copay amounts may vary at other pharmacies due to pharmacy/plan contracts, or as the patient moves through the different stages of their insurance plan.   PA #/Case ID/Reference #: [id number]

## 2023-09-05 ENCOUNTER — Other Ambulatory Visit (HOSPITAL_BASED_OUTPATIENT_CLINIC_OR_DEPARTMENT_OTHER): Payer: Self-pay

## 2023-09-05 MED ORDER — REPATHA SURECLICK 140 MG/ML ~~LOC~~ SOAJ
140.0000 mg | SUBCUTANEOUS | 11 refills | Status: DC
  Filled 2023-09-05: qty 2, 28d supply, fill #0
  Filled 2023-09-21 – 2023-09-26 (×2): qty 2, 28d supply, fill #1
  Filled 2023-10-31: qty 2, 28d supply, fill #2
  Filled 2023-11-21: qty 2, 28d supply, fill #3
  Filled 2023-12-19: qty 2, 28d supply, fill #4
  Filled 2024-01-16: qty 2, 28d supply, fill #5
  Filled 2024-02-06: qty 2, 28d supply, fill #6
  Filled 2024-03-04: qty 2, 28d supply, fill #7
  Filled 2024-03-31 (×2): qty 2, 28d supply, fill #8
  Filled 2024-04-23: qty 2, 28d supply, fill #9
  Filled 2024-05-20: qty 2, 28d supply, fill #10
  Filled 2024-06-17: qty 2, 28d supply, fill #11

## 2023-09-05 NOTE — Addendum Note (Signed)
Addended by: Lindell Spar on: 09/05/2023 01:32 PM   Modules accepted: Orders

## 2023-09-05 NOTE — Telephone Encounter (Signed)
Update sent to patient in MyChart 

## 2023-09-10 ENCOUNTER — Other Ambulatory Visit: Payer: Self-pay | Admitting: Acute Care

## 2023-09-10 DIAGNOSIS — Z122 Encounter for screening for malignant neoplasm of respiratory organs: Secondary | ICD-10-CM

## 2023-09-10 DIAGNOSIS — Z87891 Personal history of nicotine dependence: Secondary | ICD-10-CM

## 2023-09-10 DIAGNOSIS — F17210 Nicotine dependence, cigarettes, uncomplicated: Secondary | ICD-10-CM

## 2023-09-21 ENCOUNTER — Other Ambulatory Visit (HOSPITAL_BASED_OUTPATIENT_CLINIC_OR_DEPARTMENT_OTHER): Payer: Self-pay

## 2023-10-22 ENCOUNTER — Other Ambulatory Visit (HOSPITAL_BASED_OUTPATIENT_CLINIC_OR_DEPARTMENT_OTHER): Payer: Self-pay

## 2023-10-31 ENCOUNTER — Other Ambulatory Visit (HOSPITAL_BASED_OUTPATIENT_CLINIC_OR_DEPARTMENT_OTHER): Payer: Self-pay

## 2023-12-19 ENCOUNTER — Other Ambulatory Visit: Payer: Self-pay

## 2024-01-16 ENCOUNTER — Other Ambulatory Visit: Payer: Self-pay

## 2024-01-23 ENCOUNTER — Ambulatory Visit (INDEPENDENT_AMBULATORY_CARE_PROVIDER_SITE_OTHER): Payer: Medicare Other | Admitting: Internal Medicine

## 2024-01-23 ENCOUNTER — Other Ambulatory Visit (HOSPITAL_BASED_OUTPATIENT_CLINIC_OR_DEPARTMENT_OTHER): Payer: Self-pay

## 2024-01-23 VITALS — BP 112/68 | HR 100 | Ht 62.0 in | Wt 135.2 lb

## 2024-01-23 DIAGNOSIS — E7801 Familial hypercholesterolemia: Secondary | ICD-10-CM

## 2024-01-23 DIAGNOSIS — R931 Abnormal findings on diagnostic imaging of heart and coronary circulation: Secondary | ICD-10-CM | POA: Diagnosis not present

## 2024-01-23 DIAGNOSIS — M791 Myalgia, unspecified site: Secondary | ICD-10-CM | POA: Diagnosis not present

## 2024-01-23 DIAGNOSIS — E78019 Familial hypercholesterolemia, unspecified: Secondary | ICD-10-CM

## 2024-01-23 DIAGNOSIS — T466X5D Adverse effect of antihyperlipidemic and antiarteriosclerotic drugs, subsequent encounter: Secondary | ICD-10-CM | POA: Diagnosis not present

## 2024-01-23 MED ORDER — EZETIMIBE 10 MG PO TABS
10.0000 mg | ORAL_TABLET | Freq: Every day | ORAL | 3 refills | Status: AC
  Filled 2024-01-23: qty 90, 90d supply, fill #0
  Filled 2024-04-16: qty 90, 90d supply, fill #1
  Filled 2024-07-15: qty 90, 90d supply, fill #2
  Filled 2024-10-13: qty 90, 90d supply, fill #3

## 2024-01-23 NOTE — Progress Notes (Signed)
 LIPID CLINIC CONSULT NOTE  Chief Complaint:  Manage dyslipidemia  Primary Care Physician: Elias Else, MD  Primary Cardiologist:  None  HPI:  Natalie Lara is a 68 y.o. female who is being seen today for the evaluation of dyslipidemia at the request of No ref. provider found. this is a pleasant 68 year old female [facility] referred for evaluation management of dyslipidemia.  She had a recent coronary calcium score which was significantly elevated at 539, 96 percentile for age and sex matched controls.  This did demonstrate multivessel coronary artery calcification.  She does have a family history of multiple family members mostly women with early onset heart disease but also her son had an MI at age 66.  She has been a smoker but has not been able to quit.  She had previously trialed on statin therapies, including rosuvastatin but said this caused significant hair loss and she was not interested in trying other statins.  Her most recent lipid profile was in September of this year which showed total cholesterol 258, triglycerides 97, HDL 57 and LDL of 183.  These findings are highly suggestive of a genetic or familial hyperlipidemia especially given multiple family members with increased cholesterol and an earlier onset heart disease.  Overall her diet sounds somewhat [place] but she does try to avoid saturated fats and other sources of cholesterol.  01/23/2024  [name] is seen today in follow-up.  She has had marked improvement in her lipids on Repatha.  LDL now down to 102 from 183.  Her total cholesterol 176, glycerides 97 and HDL 57.  LP(a) was assessed and it is minimally elevated at 83.4 nmol/L.  She is tolerating the medicine very well.  She notes no hair loss and in fact it is coming back somewhat.  Unfortunately she still above her target LDL less than 70.  PMHx:  Past Medical History:  Diagnosis Date   ADD (attention deficit disorder)     Past Surgical History:  Procedure  Laterality Date   CHOLECYSTECTOMY N/A 11/25/2013   Procedure: LAPAROSCOPIC CHOLECYSTECTOMY WITH INTRAOPERATIVE CHOLANGIOGRAM;  Surgeon: Adolph Pollack, MD;  Location: [facility];  Service: General;  Laterality: N/A;   TUBAL LIGATION      FAMHx:  No family history on file.  SOCHx:   reports that she quit smoking about 35 years ago. Her smoking use included cigarettes. She started smoking about 47 years ago. She has a 37.2 pack-year smoking history. She has never used smokeless tobacco. She reports current alcohol use. She reports that she does not use drugs.  ALLERGIES:  Allergies  Allergen Reactions   Other Other (See Comments)    Patient stated that she can not take steroids    ROS: Pertinent items noted in HPI and remainder of comprehensive ROS otherwise negative.  HOME MEDS: Current Outpatient Medications on File Prior to Visit  Medication Sig Dispense Refill   acyclovir (ZOVIRAX) 400 MG tablet Take 400 mg by mouth 2 (two) times daily.     ADDERALL XR 20 MG 24 hr capsule Take 20 mg by mouth every morning.  0   calcium citrate-vitamin D (CITRACAL+D) 315-200 MG-UNIT per tablet Take 1 tablet by mouth daily.     cyclobenzaprine (FLEXERIL) 10 MG tablet Take 10 mg by mouth 3 (three) times daily as needed for muscle spasms.     Evolocumab (REPATHA SURECLICK) 140 MG/ML SOAJ Inject 140 mg into the skin every 14 (fourteen) days. 2 mL 11   ibuprofen (ADVIL,MOTRIN) 600  MG tablet Take 1 tablet (600 mg total) by mouth every 6 (six) hours as needed. 30 tablet 0   Multiple Vitamins-Minerals (WOMENS MULTIVITAMIN PLUS PO) Take 1 tablet by mouth daily.     albuterol (PROVENTIL HFA;VENTOLIN HFA) 108 (90 Base) MCG/ACT inhaler Inhale 2 puffs into the lungs every 4 (four) hours as needed for wheezing or shortness of breath. 1 Inhaler 0   Guaifenesin (MUCINEX MAXIMUM STRENGTH) 1200 MG TB12 Take 1 tablet (1,200 mg total) by mouth every 12 (twelve) hours as needed. 14 tablet 1   HYDROcodone-acetaminophen  (NORCO/VICODIN) 5-325 MG per tablet Take 1 tablet by mouth 2 (two) times daily as needed for moderate pain or severe pain.      HYDROcodone-homatropine (HYCODAN) 5-1.5 MG/5ML syrup Take 5 mLs by mouth every 8 (eight) hours as needed for cough. 120 mL 0   LYSINE PO Take 1 capsule by mouth daily.     naproxen (NAPROSYN) 500 MG tablet Take 1 tablet by mouth 2 (two) times daily. Reported on 01/22/2016     traMADol (ULTRAM) 50 MG tablet Take 1 tablet (50 mg total) by mouth every 6 (six) hours as needed for severe pain. 8 tablet 0   No current facility-administered medications on file prior to visit.    LABS/IMAGING: No results found for this or any previous visit (from the past 48 hours). No results found.  LIPID PANEL: No results found for: "CHOL", "TRIG", "HDL", "CHOLHDL", "VLDL", "LDLCALC", "LDLDIRECT"  WEIGHTS: Wt Readings from Last 3 Encounters:  01/23/24 135 lb 3.2 oz (61.3 kg)  09/03/23 142 lb (64.4 kg)  01/22/16 148 lb (67.1 kg)    VITALS: BP 112/68   Pulse 100   Ht 5\' 2"  (1.575 m)   Wt 135 lb 3.2 oz (61.3 kg)   LMP 01/25/2015   SpO2 98%   BMI 24.73 kg/m   EXAM: Deferred  EKG: Deferred  ASSESSMENT: Possible familial hyperlipidemia Multiple family members with high cholesterol and early onset heart disease Statin intolerance-hair loss Significant coronary artery calcification with a CAC score of 539, 96 percentile (2024) Ongoing tobacco use  PLAN: 1.   Ms. Glace has done well on Repatha.  Her LDL is now just over 100 but would be ideally treated to goal less than 70.  We talked about additional therapy that might be beneficial and I advise adding ezetimibe 10 mg daily.  Will continue with the Repatha.  Plan to repeat lipid NMR in about 3 to 4 months and I will contact her with those results.  If well-controlled we will likely be able to see her back on an annual basis just to refill her medications.  Chrystie Nose, MD, [facility], FACP  [place]  [facility] HeartCare   Medical Director of the Advanced Lipid Disorders &  Cardiovascular Risk Reduction Clinic Diplomate of the American Board of Clinical Lipidology Attending Cardiologist  Direct Dial: [phone]  Fax: [phone]  Website:  [email or web address] [email or web address] 01/23/2024, 4:14 PM

## 2024-01-23 NOTE — Patient Instructions (Addendum)
 Medication Instructions:  START zetia 10mg  once daily  *If you need a refill on your cardiac medications before your next appointment, please call your pharmacy*   Lab Work: FASTING lab work in 3-4 months  If you have labs (blood work) drawn today and your tests are completely normal, you will receive your results only by: MyChart Message (if you have MyChart) OR A paper copy in the mail If you have any lab test that is abnormal or we need to change your treatment, we will call you to review the results.   Follow-Up: At [facility], you and your health needs are our priority.  As part of our continuing mission to provide you with exceptional heart care, we have created designated Provider Care Teams.  These Care Teams include your primary Cardiologist (physician) and Advanced Practice Providers (APPs -  Physician Assistants and Nurse Practitioners) who all work together to provide you with the care you need, when you need it.  We recommend signing up for the patient portal called "MyChart".  Sign up information is provided on this After Visit Summary.  MyChart is used to connect with patients for Virtual Visits (Telemedicine).  Patients are able to view lab/test results, encounter notes, upcoming appointments, etc.  Non-urgent messages can be sent to your provider as well.   To learn more about what you can do with MyChart, go to [email or web address].    12 months with Eligha Bridegroom NP -- lipid clinic

## 2024-03-04 ENCOUNTER — Other Ambulatory Visit (HOSPITAL_BASED_OUTPATIENT_CLINIC_OR_DEPARTMENT_OTHER): Payer: Self-pay

## 2024-03-31 ENCOUNTER — Other Ambulatory Visit (HOSPITAL_BASED_OUTPATIENT_CLINIC_OR_DEPARTMENT_OTHER): Payer: Self-pay

## 2024-04-24 ENCOUNTER — Other Ambulatory Visit (HOSPITAL_BASED_OUTPATIENT_CLINIC_OR_DEPARTMENT_OTHER): Payer: Self-pay

## 2024-05-20 ENCOUNTER — Other Ambulatory Visit (HOSPITAL_BASED_OUTPATIENT_CLINIC_OR_DEPARTMENT_OTHER): Payer: Self-pay

## 2024-05-21 ENCOUNTER — Ambulatory Visit (HOSPITAL_BASED_OUTPATIENT_CLINIC_OR_DEPARTMENT_OTHER): Payer: Self-pay | Admitting: Internal Medicine

## 2024-05-21 ENCOUNTER — Other Ambulatory Visit (HOSPITAL_BASED_OUTPATIENT_CLINIC_OR_DEPARTMENT_OTHER): Payer: Self-pay

## 2024-05-21 LAB — NMR, LIPOPROFILE
Cholesterol, Total: 130 mg/dL (ref 100–199)
HDL Particle Number: 34.9 umol/L (ref >=30.5)
HDL-C: 61 mg/dL (ref >39)
LDL Particle Number: 608 nmol/L (ref <1000)
LDL Size: 20.4 nm — ABNORMAL LOW (ref >20.5)
LDL-C (NIH Calc): 52 mg/dL (ref 0–99)
LP-IR Score: 31 (ref <=45)
Small LDL Particle Number: 333 nmol/L (ref <=527)
Triglycerides: 89 mg/dL (ref 0–149)

## 2024-06-04 ENCOUNTER — Encounter: Payer: Self-pay | Admitting: Acute Care

## 2024-06-17 ENCOUNTER — Other Ambulatory Visit (HOSPITAL_BASED_OUTPATIENT_CLINIC_OR_DEPARTMENT_OTHER): Payer: Self-pay

## 2024-07-16 ENCOUNTER — Other Ambulatory Visit: Payer: Self-pay

## 2024-07-16 ENCOUNTER — Other Ambulatory Visit (HOSPITAL_BASED_OUTPATIENT_CLINIC_OR_DEPARTMENT_OTHER): Payer: Self-pay

## 2024-07-16 ENCOUNTER — Other Ambulatory Visit (HOSPITAL_COMMUNITY): Payer: Self-pay

## 2024-07-16 ENCOUNTER — Other Ambulatory Visit (HOSPITAL_BASED_OUTPATIENT_CLINIC_OR_DEPARTMENT_OTHER): Payer: Self-pay | Admitting: Internal Medicine

## 2024-07-16 DIAGNOSIS — R931 Abnormal findings on diagnostic imaging of heart and coronary circulation: Secondary | ICD-10-CM

## 2024-07-16 DIAGNOSIS — E7801 Familial hypercholesterolemia: Secondary | ICD-10-CM

## 2024-07-16 MED ORDER — REPATHA SURECLICK 140 MG/ML ~~LOC~~ SOAJ
140.0000 mg | SUBCUTANEOUS | 3 refills | Status: AC
  Filled 2024-07-16: qty 6, 84d supply, fill #0
  Filled 2024-10-01: qty 6, 84d supply, fill #1

## 2024-07-25 LAB — LAB REPORT - SCANNED: EGFR: 68

## 2024-08-29 ENCOUNTER — Ambulatory Visit
Admission: RE | Admit: 2024-08-29 | Discharge: 2024-08-29 | Disposition: A | Source: Ambulatory Visit | Attending: Acute Care | Admitting: Acute Care

## 2024-08-29 DIAGNOSIS — F17210 Nicotine dependence, cigarettes, uncomplicated: Secondary | ICD-10-CM

## 2024-08-29 DIAGNOSIS — Z122 Encounter for screening for malignant neoplasm of respiratory organs: Secondary | ICD-10-CM

## 2024-08-29 DIAGNOSIS — Z87891 Personal history of nicotine dependence: Secondary | ICD-10-CM

## 2024-09-01 ENCOUNTER — Other Ambulatory Visit (HOSPITAL_BASED_OUTPATIENT_CLINIC_OR_DEPARTMENT_OTHER): Payer: Self-pay

## 2024-09-02 ENCOUNTER — Other Ambulatory Visit: Payer: Self-pay

## 2024-09-02 DIAGNOSIS — Z122 Encounter for screening for malignant neoplasm of respiratory organs: Secondary | ICD-10-CM

## 2024-09-02 DIAGNOSIS — F17210 Nicotine dependence, cigarettes, uncomplicated: Secondary | ICD-10-CM

## 2024-09-02 DIAGNOSIS — Z87891 Personal history of nicotine dependence: Secondary | ICD-10-CM
